# Patient Record
Sex: Male | Born: 1990
Health system: Southern US, Community
[De-identification: ages and names within clinical notes are randomized; demographics above are authoritative.]

## PROBLEM LIST (undated history)

## (undated) DIAGNOSIS — M5416 Radiculopathy, lumbar region: Secondary | ICD-10-CM

## (undated) HISTORY — PX: NO PAST SURGERIES: SHX2092

## (undated) HISTORY — DX: Radiculopathy, lumbar region: M54.16

---

## 2013-06-19 ENCOUNTER — Emergency Department: Payer: Self-pay | Admitting: Emergency Medicine

## 2013-06-19 LAB — URINALYSIS, COMPLETE
Bacteria: NONE SEEN
Blood: NEGATIVE
Glucose,UR: NEGATIVE mg/dL (ref 0–75)
Ketone: NEGATIVE
Leukocyte Esterase: NEGATIVE
Nitrite: NEGATIVE
Protein: NEGATIVE
RBC,UR: <1 /HPF (ref 0–5)
Specific Gravity: 1.027 (ref 1.003–1.030)
Squamous Epithelial: NONE SEEN
WBC UR: 1 /HPF (ref 0–5)

## 2013-06-19 LAB — CBC WITH DIFFERENTIAL/PLATELET
Basophil %: 0.6 %
Eosinophil #: 0.2 10*3/uL (ref 0.0–0.7)
Eosinophil %: 2.8 %
HGB: 16.1 g/dL (ref 13.0–18.0)
Lymphocyte #: 2 10*3/uL (ref 1.0–3.6)
MCV: 90 fL (ref 80–100)
Monocyte #: 0.9 x10 3/mm (ref 0.2–1.0)
Monocyte %: 12.1 %
Neutrophil #: 4.2 10*3/uL (ref 1.4–6.5)
Neutrophil %: 57.3 %
Platelet: 190 10*3/uL (ref 150–440)

## 2013-06-19 LAB — COMPREHENSIVE METABOLIC PANEL
Albumin: 3.8 g/dL (ref 3.4–5.0)
Alkaline Phosphatase: 88 U/L
Anion Gap: 5 — ABNORMAL LOW (ref 7–16)
EGFR (Non-African Amer.): >60
Osmolality: 278 (ref 275–301)
Sodium: 140 mmol/L (ref 136–145)
Total Protein: 7.4 g/dL (ref 6.4–8.2)

## 2013-06-19 LAB — LIPASE, BLOOD: Lipase: 151 U/L (ref 73–393)

## 2013-07-13 ENCOUNTER — Ambulatory Visit (INDEPENDENT_AMBULATORY_CARE_PROVIDER_SITE_OTHER): Payer: Managed Care, Other (non HMO) | Admitting: Family Medicine

## 2013-07-13 ENCOUNTER — Encounter: Payer: Self-pay | Admitting: Family Medicine

## 2013-07-13 VITALS — BP 100/70 | HR 64 | Ht 76.0 in | Wt 238.0 lb

## 2013-07-13 DIAGNOSIS — R1013 Epigastric pain: Secondary | ICD-10-CM

## 2013-07-13 NOTE — Progress Notes (Signed)
   Subjective:    Patient ID: Brett LevyChristoper Hegner, male    DOB: Dec 10, 1990, 23 y.o.   MRN: [id number]  HPI Approximately 3 weeks ago [facility] bloating pain and diarrhea. They apparently did an ultrasound and blood work. He was given Pepcid. One or 2 days later he was feeling better. He has had 3 episodes. The last episode occurred 4 days ago. He's had continuous pain since then. He notes that when he eats a meal, approximately 15 minutes later he will have constant pain bloating and diarrhea   Review of Systems     Objective:   Physical Exam Alert and in no distress. Cardiac exam shows regular rhythm without murmurs or gallops. Lungs clear to auscultation. Abdominal exam shows decreased bowel sounds with some slight midepigastric tenderness no rebound. Negative Murphy sign no Murphy's punch.      Assessment & Plan:  Midepigastric pain  he is to use 40 mg Prilosec to see if this will help. Also recommend he keep track of foods that make it worse specifically greasy foods. Get the records from the emergency room. Possibly order a HIDA scan.

## 2013-07-13 NOTE — Patient Instructions (Signed)
Take 2 Prilosec daily and pay attention to foods that tend to make this worse specifically greasy foods. Call me in a couple of days and let me know how you're doing

## 2013-07-15 ENCOUNTER — Telehealth: Payer: Self-pay | Admitting: *Deleted

## 2013-07-15 DIAGNOSIS — R109 Unspecified abdominal pain: Secondary | ICD-10-CM

## 2013-07-15 NOTE — Telephone Encounter (Signed)
Spoke with insurance prior approval. States they will need to have a peer to peer to approve HIDA scan. Would not speak with me, they needed a MD. Please call [phone]  Reference # [id number]

## 2013-07-16 NOTE — Telephone Encounter (Signed)
Appt sch 07-23-13 @9 :00. Prior auth. Approved [id number]  Pt aware of appt time

## 2013-07-23 ENCOUNTER — Encounter (HOSPITAL_COMMUNITY)
Admission: RE | Admit: 2013-07-23 | Discharge: 2013-07-23 | Disposition: A | Discharge status: Home / Self Care | Payer: 59 | Source: Ambulatory Visit | Attending: Family Medicine | Admitting: Family Medicine

## 2013-07-23 DIAGNOSIS — R109 Unspecified abdominal pain: Secondary | ICD-10-CM | POA: Insufficient documentation

## 2013-07-23 MED ORDER — TECHNETIUM TC 99M MEBROFENIN IV KIT
5.0000 | PACK | Freq: Once | INTRAVENOUS | Status: AC | PRN
  Administered 2013-07-23: 5 via INTRAVENOUS

## 2013-07-29 ENCOUNTER — Encounter: Payer: Self-pay | Admitting: Family Medicine

## 2013-07-29 ENCOUNTER — Ambulatory Visit (INDEPENDENT_AMBULATORY_CARE_PROVIDER_SITE_OTHER): Payer: Managed Care, Other (non HMO) | Admitting: Family Medicine

## 2013-07-29 VITALS — BP 116/68 | HR 72 | Wt 230.0 lb

## 2013-07-29 DIAGNOSIS — R109 Unspecified abdominal pain: Secondary | ICD-10-CM

## 2013-07-29 NOTE — Patient Instructions (Signed)
Take the Prilosec as needed for your pain and  pay attention to what particular foods might bring it on and if you can identify any food, go ahead and stop it.

## 2013-07-29 NOTE — Progress Notes (Signed)
   Subjective:    Patient ID: Brett Graves, male    DOB: June 10, 1991, 23 y.o.   MRN: [id number]  HPI He is here for a recheck. He now states that he is having pain every 45 days. He does seem to be related to eating steaks and salads. He does state that the prosthetic does help get rid of the pain much more quickly.  Review of Systems     Objective:   Physical Exam Alert and in no distress. Abdominal exam shows normal bowel sounds without masses or tenderness.       Assessment & Plan:  Abdominal pain  encouraged him to use Prilosec as needed. Also discussed keeping track of any particular foods that might make this worse and stop it if he finds them.

## 2013-08-06 ENCOUNTER — Encounter: Payer: Self-pay | Admitting: Family Medicine

## 2016-02-04 ENCOUNTER — Emergency Department (HOSPITAL_COMMUNITY)
Admission: EM | Admit: 2016-02-04 | Discharge: 2016-02-04 | Disposition: A | Discharge status: Home / Self Care | Payer: Self-pay | Attending: Emergency Medicine | Admitting: Emergency Medicine

## 2016-02-04 ENCOUNTER — Encounter (HOSPITAL_COMMUNITY): Payer: Self-pay | Admitting: Emergency Medicine

## 2016-02-04 DIAGNOSIS — M79604 Pain in right leg: Secondary | ICD-10-CM | POA: Insufficient documentation

## 2016-02-04 DIAGNOSIS — Z79899 Other long term (current) drug therapy: Secondary | ICD-10-CM | POA: Insufficient documentation

## 2016-02-04 MED ORDER — DICLOFENAC SODIUM 75 MG PO TBEC: 75.0000 mg | DELAYED_RELEASE_TABLET | Freq: Two times a day (BID) | ORAL | 0 refills | Status: DC

## 2016-02-04 NOTE — Discharge Instructions (Signed)
You are scheduled to have an ultrasound of your right leg tomorrow 02/05/16 here at [facility].  Your appt time is 8:30am.  Be here 15 minutes early to register.  Call the orthopedic doctor listed to arrange a follow-up appt.

## 2016-02-04 NOTE — ED Triage Notes (Signed)
Pt states his right leg went through a "dock plate" and was scratched up.  This has healed and pt states bruising has developed over the past few days on ankle with a large "knot" on lateral side of shin.

## 2016-02-04 NOTE — ED Provider Notes (Signed)
[facility]-EMERGENCY DEPT Provider Note   CSN: [id number] Arrival date & time: 02/04/16  1704  First Provider Contact:   First MD Initiated Contact with Patient 02/04/16 1807     By signing my name below, I, Soijett Blue, attest that this documentation has been prepared under the direction and in the presence of Pauline Aus, PA-C Electronically Signed: Soijett Blue, ED Scribe. 02/04/16. 6:24 PM.   History   Chief Complaint Chief Complaint  Patient presents with  . Leg Pain    HPI Brett Graves is a 25 y.o. male who presents to the Emergency Department complaining of right leg pain onset 2 weeks ago. Pt notes that his right leg slipped and went through a makeshift dock plate that wasn't properly secured to the trailer. Pt states that his right leg struck the trailer and the makeshift dock plate during the incident. Pt reports that there scratches initially to the area, and he noticed bruising to the area today. Pt states that since the incident he has been able to ambulate but not without pain to the area. Pt is having associated symptoms of bruising to right ankle and right lower leg, "knot" to lateral side of shin, numbness to right lateral lower leg, and gait problem due to pain. He notes that he has not tried any medications for the relief of his symptoms. He denies calf tenderness, right knee pain, right ankle pain, and any other symptoms.   The history is provided by the patient. No language interpreter was used.  Leg Pain   This is a new problem. The problem occurs rarely. The problem has been gradually worsening. The pain is present in the right lower leg. The pain is mild. Associated symptoms include numbness (right lateral lower leg). The symptoms are aggravated by activity and contact. He has tried cold for the symptoms. The treatment provided no relief.    History reviewed. No pertinent past medical history.  There are no active problems to display for this  patient.   History reviewed. No pertinent surgical history.     Home Medications    Prior to Admission medications   Medication Sig Start Date End Date Taking? Authorizing Provider  omeprazole (PRILOSEC OTC) 20 MG tablet Take 20 mg by mouth daily.    Historical Provider, MD    Family History History reviewed. No pertinent family history.  Social History Social History  Substance Use Topics  . Smoking status: Never Smoker  . Smokeless tobacco: Never Used  . Alcohol use No     Allergies   Review of patient's allergies indicates no known allergies.   Review of Systems Review of Systems  Musculoskeletal: Positive for gait problem (due to pain) and myalgias (right lower leg). Negative for arthralgias.  Skin: Positive for color change (bruising to right lower leg). Negative for rash and wound.  Neurological: Positive for numbness (right lateral lower leg).     Physical Exam Updated Vital Signs BP 141/72 (BP Location: Left Arm)   Pulse 67   Temp 97.7 F (36.5 C) (Oral)   Resp 16   Ht  (1.93 m)   Wt 250 lb (113.4 kg)   SpO2 100%   BMI 30.43 kg/m   Physical Exam  Constitutional: He is oriented to person, place, and time. He appears well-developed and well-nourished. No distress.  HENT:  Head: Normocephalic and atraumatic.  Eyes: EOM are normal.  Neck: Neck supple.  Cardiovascular: Normal rate.   Pulmonary/Chest: Effort normal. No respiratory distress.  Abdominal: He exhibits no distension.  Musculoskeletal: Normal range of motion.       Right ankle: Achilles tendon normal. Achilles tendon exhibits normal Thompson's test results.       Right lower leg: He exhibits deformity.   5 cm palpable mass to the lateral lower leg. No erythema. Old appearing bruises to the posterior and lateral lower leg. Compartments soft. Negative Thompson test.   Neurological: He is alert and oriented to person, place, and time.  Skin: Skin is warm and dry.  Psychiatric: He has a  normal mood and affect. His behavior is normal.  Nursing note and vitals reviewed.    ED Treatments / Results  DIAGNOSTIC STUDIES: Oxygen Saturation is 100% on RA, nl by my interpretation.    COORDINATION OF CARE: 6:16 PM Discussed treatment plan with pt at bedside which includes consult with attending and follow up for US venous DVT study tomorrow and pt agreed to plan.   Labs (all labs ordered are listed, but only abnormal results are displayed) Labs Reviewed - No data to display  EKG  EKG Interpretation None       Radiology No results found.  Procedures Procedures (including critical care time)  Medications Ordered in ED Medications - No data to display   Initial Impression / Assessment and Plan / ED Course  I have reviewed the triage vital signs and the nursing notes.  Pertinent labs & imaging results that were available during my care of the patient were reviewed by me and considered in my medical decision making (see chart for details).  Clinical Course    Injury to RLE.  NV intact.  No open wounds.  Possible muscle injury vs hematoma.  Pt scheduled to return here for venous US of the extremity.  Orthopedic referral given.  Final Clinical Impressions(s) / ED Diagnoses   Final diagnoses:  Right leg pain    New Prescriptions New Prescriptions   No medications on file   I personally performed the services described in this documentation, which was scribed in my presence. The recorded information has been reviewed and is accurate.     Pauline Aus, PA-C 02/22/16 1305    Raeford Razor, MD 02/23/16 [phone]

## 2016-02-04 NOTE — ED Triage Notes (Signed)
Pt states his calf has had increasing numbness since injury.

## 2016-02-05 ENCOUNTER — Ambulatory Visit (HOSPITAL_COMMUNITY)
Admission: RE | Admit: 2016-02-05 | Discharge: 2016-02-05 | Disposition: A | Discharge status: Home / Self Care | Payer: Self-pay | Source: Ambulatory Visit | Attending: Emergency Medicine | Admitting: Emergency Medicine

## 2016-02-05 DIAGNOSIS — M7989 Other specified soft tissue disorders: Secondary | ICD-10-CM | POA: Insufficient documentation

## 2016-02-05 DIAGNOSIS — M79604 Pain in right leg: Secondary | ICD-10-CM | POA: Insufficient documentation

## 2016-02-05 NOTE — ED Provider Notes (Signed)
Pt returns today for venous US of right lower leg, negative for DVT but with moderate sized suspected hematoma lateral to right shin.  Results discussed with patient.  Advised heat tx,, elevation, f/u with Dr. Romeo Apple whom he was referred to last night if sx persist or do not improve with conservative tx.  Advised anti inflammatories which were prescribed ytd.    Burgess Amor, PA-C 02/05/16 1719    Vanetta Mulders, MD 02/06/16 [phone]

## 2017-12-08 DIAGNOSIS — M9903 Segmental and somatic dysfunction of lumbar region: Secondary | ICD-10-CM | POA: Diagnosis not present

## 2017-12-08 DIAGNOSIS — M5442 Lumbago with sciatica, left side: Secondary | ICD-10-CM | POA: Diagnosis not present

## 2017-12-08 DIAGNOSIS — M9902 Segmental and somatic dysfunction of thoracic region: Secondary | ICD-10-CM | POA: Diagnosis not present

## 2017-12-11 DIAGNOSIS — M9902 Segmental and somatic dysfunction of thoracic region: Secondary | ICD-10-CM | POA: Diagnosis not present

## 2017-12-11 DIAGNOSIS — M9903 Segmental and somatic dysfunction of lumbar region: Secondary | ICD-10-CM | POA: Diagnosis not present

## 2017-12-11 DIAGNOSIS — M5442 Lumbago with sciatica, left side: Secondary | ICD-10-CM | POA: Diagnosis not present

## 2017-12-15 DIAGNOSIS — M5442 Lumbago with sciatica, left side: Secondary | ICD-10-CM | POA: Diagnosis not present

## 2017-12-15 DIAGNOSIS — M9903 Segmental and somatic dysfunction of lumbar region: Secondary | ICD-10-CM | POA: Diagnosis not present

## 2017-12-15 DIAGNOSIS — M9902 Segmental and somatic dysfunction of thoracic region: Secondary | ICD-10-CM | POA: Diagnosis not present

## 2017-12-17 DIAGNOSIS — M9903 Segmental and somatic dysfunction of lumbar region: Secondary | ICD-10-CM | POA: Diagnosis not present

## 2017-12-17 DIAGNOSIS — M5442 Lumbago with sciatica, left side: Secondary | ICD-10-CM | POA: Diagnosis not present

## 2017-12-17 DIAGNOSIS — M9902 Segmental and somatic dysfunction of thoracic region: Secondary | ICD-10-CM | POA: Diagnosis not present

## 2017-12-26 DIAGNOSIS — M5442 Lumbago with sciatica, left side: Secondary | ICD-10-CM | POA: Diagnosis not present

## 2017-12-26 DIAGNOSIS — M9903 Segmental and somatic dysfunction of lumbar region: Secondary | ICD-10-CM | POA: Diagnosis not present

## 2017-12-26 DIAGNOSIS — M9902 Segmental and somatic dysfunction of thoracic region: Secondary | ICD-10-CM | POA: Diagnosis not present

## 2018-01-05 DIAGNOSIS — M9902 Segmental and somatic dysfunction of thoracic region: Secondary | ICD-10-CM | POA: Diagnosis not present

## 2018-01-05 DIAGNOSIS — M5442 Lumbago with sciatica, left side: Secondary | ICD-10-CM | POA: Diagnosis not present

## 2018-01-05 DIAGNOSIS — M9903 Segmental and somatic dysfunction of lumbar region: Secondary | ICD-10-CM | POA: Diagnosis not present

## 2018-02-05 DIAGNOSIS — M9903 Segmental and somatic dysfunction of lumbar region: Secondary | ICD-10-CM | POA: Diagnosis not present

## 2018-02-05 DIAGNOSIS — M9902 Segmental and somatic dysfunction of thoracic region: Secondary | ICD-10-CM | POA: Diagnosis not present

## 2018-02-05 DIAGNOSIS — M5442 Lumbago with sciatica, left side: Secondary | ICD-10-CM | POA: Diagnosis not present

## 2018-03-19 DIAGNOSIS — M545 Low back pain: Secondary | ICD-10-CM | POA: Diagnosis not present

## 2018-03-27 DIAGNOSIS — M545 Low back pain: Secondary | ICD-10-CM | POA: Diagnosis not present

## 2018-03-30 DIAGNOSIS — M5106 Intervertebral disc disorders with myelopathy, lumbar region: Secondary | ICD-10-CM | POA: Diagnosis not present

## 2018-04-16 DIAGNOSIS — M5106 Intervertebral disc disorders with myelopathy, lumbar region: Secondary | ICD-10-CM | POA: Diagnosis not present

## 2018-04-16 DIAGNOSIS — M545 Low back pain: Secondary | ICD-10-CM | POA: Diagnosis not present

## 2018-05-05 ENCOUNTER — Encounter (HOSPITAL_COMMUNITY): Payer: Self-pay

## 2018-05-05 ENCOUNTER — Observation Stay (HOSPITAL_COMMUNITY)
Admission: EM | Admit: 2018-05-05 | Discharge: 2018-05-08 | Disposition: A | Discharge status: Home / Self Care | Payer: 59 | Attending: Internal Medicine | Admitting: Internal Medicine

## 2018-05-05 ENCOUNTER — Other Ambulatory Visit: Payer: Self-pay

## 2018-05-05 DIAGNOSIS — D72829 Elevated white blood cell count, unspecified: Secondary | ICD-10-CM | POA: Diagnosis not present

## 2018-05-05 DIAGNOSIS — R7989 Other specified abnormal findings of blood chemistry: Secondary | ICD-10-CM | POA: Diagnosis present

## 2018-05-05 DIAGNOSIS — M5416 Radiculopathy, lumbar region: Secondary | ICD-10-CM | POA: Diagnosis not present

## 2018-05-05 DIAGNOSIS — E871 Hypo-osmolality and hyponatremia: Secondary | ICD-10-CM | POA: Insufficient documentation

## 2018-05-05 DIAGNOSIS — M5117 Intervertebral disc disorders with radiculopathy, lumbosacral region: Secondary | ICD-10-CM | POA: Diagnosis not present

## 2018-05-05 DIAGNOSIS — M545 Low back pain: Secondary | ICD-10-CM | POA: Diagnosis not present

## 2018-05-05 DIAGNOSIS — R000 Tachycardia, unspecified: Secondary | ICD-10-CM | POA: Diagnosis not present

## 2018-05-05 DIAGNOSIS — Z79899 Other long term (current) drug therapy: Secondary | ICD-10-CM | POA: Diagnosis not present

## 2018-05-05 DIAGNOSIS — R945 Abnormal results of liver function studies: Secondary | ICD-10-CM

## 2018-05-05 DIAGNOSIS — M5116 Intervertebral disc disorders with radiculopathy, lumbar region: Secondary | ICD-10-CM | POA: Diagnosis present

## 2018-05-05 DIAGNOSIS — G8929 Other chronic pain: Secondary | ICD-10-CM | POA: Insufficient documentation

## 2018-05-05 DIAGNOSIS — M48061 Spinal stenosis, lumbar region without neurogenic claudication: Secondary | ICD-10-CM | POA: Insufficient documentation

## 2018-05-05 DIAGNOSIS — R52 Pain, unspecified: Secondary | ICD-10-CM

## 2018-05-05 DIAGNOSIS — Z791 Long term (current) use of non-steroidal anti-inflammatories (NSAID): Secondary | ICD-10-CM | POA: Insufficient documentation

## 2018-05-05 DIAGNOSIS — K5900 Constipation, unspecified: Secondary | ICD-10-CM | POA: Insufficient documentation

## 2018-05-05 DIAGNOSIS — M5442 Lumbago with sciatica, left side: Secondary | ICD-10-CM | POA: Diagnosis not present

## 2018-05-05 MED ORDER — OXYCODONE-ACETAMINOPHEN 5-325 MG PO TABS
2.0000 | ORAL_TABLET | ORAL | Status: AC | PRN
  Administered 2018-05-05 – 2018-05-07 (×2): 2 via ORAL
  Filled 2018-05-05 (×2): qty 2

## 2018-05-05 NOTE — ED Triage Notes (Signed)
Pt complains of left lower back pain radiating down on the left leg, has hx of degenerative disc disease. Has an appointment to see a neurologist Monday. Having difficulty ambulating

## 2018-05-06 ENCOUNTER — Emergency Department (HOSPITAL_COMMUNITY): Payer: 59

## 2018-05-06 ENCOUNTER — Observation Stay (HOSPITAL_COMMUNITY): Payer: 59

## 2018-05-06 DIAGNOSIS — M5117 Intervertebral disc disorders with radiculopathy, lumbosacral region: Secondary | ICD-10-CM

## 2018-05-06 DIAGNOSIS — M5116 Intervertebral disc disorders with radiculopathy, lumbar region: Secondary | ICD-10-CM | POA: Diagnosis not present

## 2018-05-06 DIAGNOSIS — Z8269 Family history of other diseases of the musculoskeletal system and connective tissue: Secondary | ICD-10-CM

## 2018-05-06 DIAGNOSIS — G8929 Other chronic pain: Secondary | ICD-10-CM

## 2018-05-06 DIAGNOSIS — D72829 Elevated white blood cell count, unspecified: Secondary | ICD-10-CM

## 2018-05-06 DIAGNOSIS — E871 Hypo-osmolality and hyponatremia: Secondary | ICD-10-CM

## 2018-05-06 DIAGNOSIS — R740 Nonspecific elevation of levels of transaminase and lactic acid dehydrogenase [LDH]: Secondary | ICD-10-CM | POA: Diagnosis not present

## 2018-05-06 DIAGNOSIS — M545 Low back pain: Secondary | ICD-10-CM | POA: Diagnosis not present

## 2018-05-06 LAB — CBC WITH DIFFERENTIAL/PLATELET
ABS IMMATURE GRANULOCYTES: 0.07 10*3/uL (ref 0.00–0.07)
BASOS PCT: 0 %
Basophils Absolute: 0 10*3/uL (ref 0.0–0.1)
Eosinophils Absolute: 0 10*3/uL (ref 0.0–0.5)
Eosinophils Relative: 0 %
HEMATOCRIT: 48.2 % (ref 39.0–52.0)
Hemoglobin: 15.9 g/dL (ref 13.0–17.0)
IMMATURE GRANULOCYTES: 1 %
LYMPHS ABS: 1.1 10*3/uL (ref 0.7–4.0)
Lymphocytes Relative: 8 %
MCH: 29.9 pg (ref 26.0–34.0)
MCHC: 33 g/dL (ref 30.0–36.0)
MCV: 90.8 fL (ref 80.0–100.0)
MONO ABS: 0.1 10*3/uL (ref 0.1–1.0)
MONOS PCT: 1 %
NEUTROS ABS: 11.3 10*3/uL — AB (ref 1.7–7.7)
NEUTROS PCT: 90 %
Platelets: 254 10*3/uL (ref 150–400)
RBC: 5.31 MIL/uL (ref 4.22–5.81)
RDW: 12.2 % (ref 11.5–15.5)
WBC: 12.6 10*3/uL — ABNORMAL HIGH (ref 4.0–10.5)
nRBC: 0 % (ref 0.0–0.2)

## 2018-05-06 LAB — BASIC METABOLIC PANEL
ANION GAP: 6 (ref 5–15)
BUN: 16 mg/dL (ref 6–20)
CHLORIDE: 99 mmol/L (ref 98–111)
CO2: 23 mmol/L (ref 22–32)
Calcium: 8.5 mg/dL — ABNORMAL LOW (ref 8.9–10.3)
Creatinine, Ser: 0.86 mg/dL (ref 0.61–1.24)
GFR calc Af Amer: >60 mL/min (ref >60)
GFR calc non Af Amer: >60 mL/min (ref >60)
GLUCOSE: 130 mg/dL — AB (ref 70–99)
POTASSIUM: 3.5 mmol/L (ref 3.5–5.1)
Sodium: 128 mmol/L — ABNORMAL LOW (ref 135–145)

## 2018-05-06 LAB — HEPATIC FUNCTION PANEL
ALT: 63 U/L — ABNORMAL HIGH (ref 0–44)
AST: 34 U/L (ref 15–41)
Albumin: 3.9 g/dL (ref 3.5–5.0)
Alkaline Phosphatase: 69 U/L (ref 38–126)
BILIRUBIN INDIRECT: 0.6 mg/dL (ref 0.3–0.9)
BILIRUBIN TOTAL: 0.8 mg/dL (ref 0.3–1.2)
Bilirubin, Direct: 0.2 mg/dL (ref 0.0–0.2)
Total Protein: 7.5 g/dL (ref 6.5–8.1)

## 2018-05-06 LAB — NA AND K (SODIUM & POTASSIUM), RAND UR
POTASSIUM UR: 101 mmol/L
Sodium, Ur: 68 mmol/L

## 2018-05-06 LAB — TSH: TSH: 0.662 u[IU]/mL (ref 0.350–4.500)

## 2018-05-06 LAB — HEMOGLOBIN A1C
HEMOGLOBIN A1C: 5.1 % (ref 4.8–5.6)
MEAN PLASMA GLUCOSE: 99.67 mg/dL

## 2018-05-06 LAB — OSMOLALITY: Osmolality: 298 mOsm/kg — ABNORMAL HIGH (ref 275–295)

## 2018-05-06 LAB — OSMOLALITY, URINE: OSMOLALITY UR: 891 mosm/kg (ref 300–900)

## 2018-05-06 MED ORDER — HYDROMORPHONE HCL 1 MG/ML IJ SOLN
1.0000 mg | Freq: Once | INTRAMUSCULAR | Status: AC
  Administered 2018-05-06: 1 mg via INTRAVENOUS
  Filled 2018-05-06: qty 1

## 2018-05-06 MED ORDER — SODIUM CHLORIDE 0.9 % IV SOLN
INTRAVENOUS | Status: DC
  Administered 2018-05-06 (×2): via INTRAVENOUS

## 2018-05-06 MED ORDER — KETOROLAC TROMETHAMINE 30 MG/ML IJ SOLN
30.0000 mg | Freq: Four times a day (QID) | INTRAMUSCULAR | Status: DC
  Administered 2018-05-06 – 2018-05-08 (×8): 30 mg via INTRAVENOUS
  Filled 2018-05-06 (×8): qty 1

## 2018-05-06 MED ORDER — ENOXAPARIN SODIUM 60 MG/0.6ML ~~LOC~~ SOLN
50.0000 mg | SUBCUTANEOUS | Status: DC
  Administered 2018-05-06: 50 mg via SUBCUTANEOUS
  Filled 2018-05-06: qty 0.5

## 2018-05-06 MED ORDER — HYDROMORPHONE HCL 1 MG/ML IJ SOLN
0.5000 mg | Freq: Once | INTRAMUSCULAR | Status: AC
  Administered 2018-05-06: 0.5 mg via INTRAVENOUS
  Filled 2018-05-06: qty 1

## 2018-05-06 MED ORDER — SODIUM CHLORIDE 0.9 % IV BOLUS
1000.0000 mL | Freq: Once | INTRAVENOUS | Status: AC
  Administered 2018-05-06: 1000 mL via INTRAVENOUS

## 2018-05-06 MED ORDER — KETOROLAC TROMETHAMINE 30 MG/ML IJ SOLN: 30.0000 mg | Freq: Four times a day (QID) | INTRAMUSCULAR | Status: DC | PRN

## 2018-05-06 MED ORDER — POLYETHYLENE GLYCOL 3350 17 G PO PACK
17.0000 g | PACK | Freq: Every day | ORAL | Status: DC
  Administered 2018-05-06 – 2018-05-08 (×3): 17 g via ORAL
  Filled 2018-05-06 (×3): qty 1

## 2018-05-06 MED ORDER — LIDOCAINE 5 % EX PTCH
1.0000 | MEDICATED_PATCH | CUTANEOUS | Status: DC
  Administered 2018-05-06 – 2018-05-08 (×3): 1 via TRANSDERMAL
  Filled 2018-05-06 (×3): qty 1

## 2018-05-06 MED ORDER — METHYLPREDNISOLONE SODIUM SUCC 125 MG IJ SOLR
125.0000 mg | Freq: Once | INTRAMUSCULAR | Status: AC
  Administered 2018-05-06: 125 mg via INTRAVENOUS
  Filled 2018-05-06: qty 2

## 2018-05-06 MED ORDER — DIAZEPAM 5 MG/ML IJ SOLN
5.0000 mg | Freq: Once | INTRAMUSCULAR | Status: AC
  Administered 2018-05-06: 5 mg via INTRAVENOUS
  Filled 2018-05-06: qty 2

## 2018-05-06 MED ORDER — CYCLOBENZAPRINE HCL 10 MG PO TABS
10.0000 mg | ORAL_TABLET | Freq: Three times a day (TID) | ORAL | Status: DC
  Administered 2018-05-06 – 2018-05-08 (×7): 10 mg via ORAL
  Filled 2018-05-06 (×7): qty 1

## 2018-05-06 MED ORDER — ENOXAPARIN SODIUM 60 MG/0.6ML ~~LOC~~ SOLN
60.0000 mg | SUBCUTANEOUS | Status: DC
  Administered 2018-05-07: 60 mg via SUBCUTANEOUS
  Filled 2018-05-06: qty 0.6

## 2018-05-06 MED ORDER — HYDROMORPHONE HCL 1 MG/ML IJ SOLN
0.5000 mg | INTRAMUSCULAR | Status: DC | PRN
  Administered 2018-05-06: 0.5 mg via INTRAVENOUS
  Filled 2018-05-06: qty 1

## 2018-05-06 MED ORDER — KETOROLAC TROMETHAMINE 30 MG/ML IJ SOLN
30.0000 mg | Freq: Once | INTRAMUSCULAR | Status: AC
  Administered 2018-05-06: 30 mg via INTRAVENOUS
  Filled 2018-05-06: qty 1

## 2018-05-06 NOTE — Progress Notes (Signed)
Received patient from ED, AOx4, VSS with eelvated BP at 141/80, PR 100, RR16, O2Sat at 99% on RA and pain at 3/10, oriented to room, bed controls and call light, gae ice water to drink and now resting on bed comfortably watching TV.  Will monitor.

## 2018-05-06 NOTE — ED Provider Notes (Signed)
[facility] EMERGENCY DEPARTMENT Provider Note   CSN: [id number] Arrival date & time: 05/05/18  1827     History   Chief Complaint Chief Complaint  Patient presents with  . Back Pain    HPI Brett Graves is a 27 y.o. male.  Patient with history of low back pain, DDD, recent injection by orthopedics for pain management, presents with severe exacerbation of his pain x 3 days. He is unable to bear weight on the leg due to extreme pain. He reports he has been bedridden since it started, having to crawl to the bathroom secondary to pain. No numbness or weakness of the leg. No bladder/bowel incontinence or retention. No fever, abdominal pain, groin pain. The symptoms start in the left low back and travel down the left leg anteriorly and then posteriorly to mid-calf. No known direct injury. He states his last MRI was in September of this year, and that he has an appointment with Dr. Franky Macho (neurosurgery) this week.   The history is provided by the patient. No language interpreter was used.    Past Medical History:  Diagnosis Date  . Degenerative disc disease, lumbar     There are no active problems to display for this patient.   History reviewed. No pertinent surgical history.      Home Medications    Prior to Admission medications   Medication Sig Start Date End Date Taking? Authorizing Provider  diclofenac (VOLTAREN) 75 MG EC tablet Take 1 tablet (75 mg total) by mouth 2 (two) times daily. Take with food 02/04/16   Triplett, Tammy, PA-C  omeprazole (PRILOSEC OTC) 20 MG tablet Take 20 mg by mouth daily.    [provider]    Family History History reviewed. No pertinent family history.  Social History Social History   Tobacco Use  . Smoking status: Never Smoker  . Smokeless tobacco: Never Used  Substance Use Topics  . Alcohol use: No  . Drug use: No     Allergies   Patient has no known allergies.   Review of Systems Review  of Systems  Constitutional: Positive for activity change. Negative for chills and fever.  Gastrointestinal: Negative.  Negative for abdominal pain.  Genitourinary: Negative for enuresis.  Musculoskeletal: Positive for back pain.  Skin: Negative.  Negative for color change.  Neurological: Negative.  Negative for weakness and numbness.     Physical Exam Updated Vital Signs BP 115/76 (BP Location: Left Arm)   Pulse 90   Temp 98.2 F (36.8 C) (Oral)   Resp 14   Ht 6\' 4"  (1.93 m)   Wt 117.9 kg   SpO2 98%   BMI 31.65 kg/m   Physical Exam  Constitutional: He is oriented to person, place, and time. He appears well-developed and well-nourished.  HENT:  Head: Normocephalic.  Neck: Normal range of motion. Neck supple.  Cardiovascular: Normal rate.  Pulmonary/Chest: Effort normal.  Abdominal: Soft. There is no tenderness. There is no rebound and no guarding.  Musculoskeletal: Normal range of motion.  No low back midline or paraspinal tenderness. FROM of the lower extremities without strength deficit.   Neurological: He is alert and oriented to person, place, and time. He displays normal reflexes. No sensory deficit. He exhibits normal muscle tone.  Skin: Skin is warm and dry. No rash noted.  Psychiatric: He has a normal mood and affect.     ED Treatments / Results  Labs (all labs ordered are listed, but only abnormal results are  displayed) Labs Reviewed - No data to display  EKG None  Radiology No results found. Mr Lumbar Spine Wo Contrast  Result Date: 05/06/2018 CLINICAL DATA:  Initial evaluation for acute left lower back pain extending into the left lower extremity. EXAM: MRI LUMBAR SPINE WITHOUT CONTRAST TECHNIQUE: Multiplanar, multisequence MR imaging of the lumbar spine was performed. No intravenous contrast was administered. COMPARISON:  Recent MRI from 03/27/2018. FINDINGS: Segmentation: Normal segmentation. Lowest well-formed disc labeled the L5-S1 level. Alignment:  Vertebral bodies normally aligned with preservation of the normal lumbar lordosis. No listhesis. Vertebrae: Vertebral body heights well maintained without evidence for acute or chronic fracture. Bone marrow signal intensity within normal limits. No discrete or worrisome osseous lesions. No abnormal marrow edema. Conus medullaris and cauda equina: Conus extends to the T12-L1 level. Conus and cauda equina appear normal. Paraspinal and other soft tissues: Paraspinous soft tissues within normal limits. Visualized visceral structures unremarkable. Disc levels: L1-2:  Unremarkable. L2-3:  Unremarkable. L3-4: Mild diffuse disc bulge with disc desiccation and intervertebral disc space narrowing. Associated central annular fissure. Mild bilateral facet hypertrophy. Borderline mild spinal stenosis with left lateral recess narrowing, stable. Foramina remain patent. Appearance is unchanged from previous. L4-5: Diffuse disc bulge with disc desiccation and mild intervertebral disc space narrowing. Central/right subarticular disc protrusion, extending into the right lateral recess, contacting the descending right L5 nerve root (series 12, image 25). This has slightly regressed from previous. Additional extraforaminal disc protrusion with associated annular fissure (series 12, image 24). This contacts the exiting right L4 nerve root as it courses of the right neural foramen. This appears slightly increased in size. L5-S1: Broad left subarticular disc protrusion with slight caudad angulation. Protruding disc extends into the left lateral recess, closely approximating the descending left S1 nerve root. This is slightly more prominent from previous. Mild bilateral facet hypertrophy. No significant canal stenosis. Foramina remain patent bilaterally. IMPRESSION: 1. Broad left subarticular disc protrusion at L5-S1, closely approximating and potentially affecting the descending left S1 nerve root. Finding could certainly result in left  lower extremity radicular symptoms. 2. Right extraforaminal and subarticular disc protrusions at L4-5, potentially affecting either the right L4 or descending L5 nerve roots respectively. 3. Mild disc bulge at L3-4 with borderline spinal stenosis, stable. Electronically Signed   By: Rise Mu M.D.   On: 05/06/2018 06:22    Procedures Procedures (including critical care time)  Medications Ordered in ED Medications  oxyCODONE-acetaminophen (PERCOCET/ROXICET) 5-325 MG per tablet 2 tablet (2 tablets Oral Given 05/05/18 1905)  HYDROmorphone (DILAUDID) injection 1 mg (has no administration in time range)  ketorolac (TORADOL) 30 MG/ML injection 30 mg (has no administration in time range)  diazepam (VALIUM) injection 5 mg (has no administration in time range)     Initial Impression / Assessment and Plan / ED Course  I have reviewed the triage vital signs and the nursing notes.  Pertinent labs & imaging results that were available during my care of the patient were reviewed by me and considered in my medical decision making (see chart for details).     Patient with history of low back pain here with 3 days onset of severe pain causing inability to walk. No neurologic deficits on exam. He reports having an MRI in September of this year and has been referred to neurosurgery.   Will start IV and attempt to improve/constrol pain.   MR from 03/27/18 Impression: IMPRESSION: Lumbar disc herniations at L4-5, RIGHT and L5-S1, LEFT. Annular rent L3-4.  Posterior element  hypertrophy with short pedicles contribute to mild multilevel stenosis, most prominent at L4-5.  3:40 - patient has received Dilaudid x 2, IV Valium, solumedrol and toradol. He reports he continues to be unable to sit up secondary to pain. Feel given the acute worsening and the severity of the pain, repeat MRI is warranted. Discussed this with the patient who is agreeable.    7:00 - Multiple medications for pain provided.  MRI largely unchanged from previous. Attempt to ambulate the patient fails secondary to being unable to bear weight on the left LE. Will admit for pain management.   Final Clinical Impressions(s) / ED Diagnoses   Final diagnoses:  None   1. Low back pain 2. Lumbar radiculopathy  ED Discharge Orders    None       Elpidio Anis, PA-C 05/06/18 0715    Geoffery Lyons, MD 05/06/18 [phone]

## 2018-05-06 NOTE — ED Notes (Signed)
Breakfast tray at bedside 

## 2018-05-06 NOTE — ED Notes (Signed)
Pt. Return from MRI via stretcher. 

## 2018-05-06 NOTE — Progress Notes (Signed)
Patient ID: Brett Graves, male   DOB: February 27, 1991, 27 y.o.   MRN: [id number] I have reviewed the Mri's for [name]. He does not have any significant pathology that I would attribute to the amount of pain which is described. I will see him tomorrow in consultation.

## 2018-05-06 NOTE — H&P (Addendum)
Date: 05/06/2018               Patient Name:  Brett Graves MRN: [id number]  DOB: 1991-01-31 Age / Sex: 27 y.o., male   PCP: Patient, No Pcp Per         Medical Service: Internal Medicine Teaching Service         Attending Physician: Dr. Geoffery Lyons, MD    First Contact: Dr. Maryla Morrow Pager: [phone]  Second Contact: Dr. Caron Presume Pager: [phone]       After Hours (After 5p/  First Contact Pager: [phone]  weekends / holidays): Second Contact Pager: [phone]   Chief Complaint: Worsening of lower back pain radiating to left leg for the past 4 days.  History of Present Illness: Brett Graves is a 27 y.o. gentleman with no significant past medical history except lower back pain due to DDD  for 1 year, presented to ED with worsening of his back pain to the point that he cannot stand on his legs for the past 4 days.  According to patient his pain started to get worse around May 2019 when he started seeing a chiropractor with no relief till September 2019, earlier September he saw Dr. Delbert Harness and received a steroid injection which resulted in a temporary relief of his pain lasting for few days only. Because of his worsening pain he was referred to see a neurosurgeon Dr. Franky Macho, he is having upcoming appointment this Monday on 05/11/18. For the past 3 to 4 days his pain got acutely worse to the point that he cannot bear weight on his legs especially left.  He is continuously having lower back pain radiating to the left leg all the way to the ankle.  He denies any tingling or numbness.  He denies any focal weakness.  He denies any urinary or fecal incontinence. He was using ibuprofen with not much relief, also tried his girlfriend's Flexeril which does not seem helping either.  Pain increased with sitting up and standing, relieved with lying still. He denies pain anywhere else, no blurry vision, no change in his appetite or weight.  Denies any shortness of breath.  He has an  history of occasionally getting constipated, last bowel movement was 4 days ago. He denies any urinary symptoms.  ED course.  Patient was found to be in pain and unable to bear weight.  He was neurologically intact, got 1 dose of Toradol, 3 doses of Dilaudid, 1 Valium and 1 Solu-Medrol with no relief.  Asked for admission for pain control.  Meds:  Current Meds  Medication Sig  . ibuprofen (ADVIL,MOTRIN) 200 MG tablet Take 600 mg by mouth every 6 (six) hours as needed (for back pain).  Marland Kitchen omeprazole (PRILOSEC OTC) 20 MG tablet Take 20 mg by mouth daily as needed (for indigestion or reflux).    Allergies: Allergies as of 05/05/2018  . (No Known Allergies)   Past Medical History:  Diagnosis Date  . Degenerative disc disease, lumbar     Family History: Multiple family members with degenerative disc disease and lower back problems.  Father with heart problem in 8s.  Social History: Work as a Games developer, lives with his girlfriend, denies any smoking or illicit drug use.  Uses alcohol occasionally.  Review of Systems: A complete ROS was negative except as per HPI.   Physical Exam: Blood pressure 121/81, pulse 75, temperature 98.2 F (36.8 C), temperature source Oral, resp. rate 16, height 6\' 4"  (1.93 m), weight  117.9 kg, SpO2 99 %. Vitals:   05/06/18 1004 05/06/18 1030 05/06/18 1115 05/06/18 1230  BP: 117/90 138/87 132/81 133/76  Pulse: 87 95 100 (!) 103  Resp:      Temp:      TempSrc:      SpO2: 99% 98% 97% 98%  Weight:      Height:       General: Vital signs reviewed.  Patient is well-developed and well-nourished, in no acute distress and cooperative with exam.  Head: Normocephalic and atraumatic. Eyes: EOMI, conjunctivae normal, no scleral icterus.  Neck: Supple, trachea midline, normal ROM, no JVD, masses, thyromegaly, or carotid bruit present.  Cardiovascular: RRR, S1 normal, S2 normal, no murmurs, gallops, or rubs. Pulmonary/Chest: Clear to auscultation bilaterally,  no wheezes, rales, or rhonchi. Abdominal: Soft, non-tender, non-distended, BS +, no masses, organomegaly, or guarding present.  Musculoskeletal: No joint deformities, erythema, or stiffness, ROM full and nontender.  Positive straight leg with left leg raise. Extremities: No lower extremity edema bilaterally,  pulses symmetric and intact bilaterally. No cyanosis or clubbing. Neurological: A&O x3, Strength is normal and symmetric bilaterally, cranial nerve II-XII are grossly intact, no focal motor deficit, sensory intact to light touch bilaterally.  Skin: Warm, dry and intact. No rashes or erythema. Psychiatric: Normal mood and affect. speech and behavior is normal. Cognition and memory are normal.  MRI lumbar spine. FINDINGS: Segmentation: Normal segmentation. Lowest well-formed disc labeled the L5-S1 level.  Alignment: Vertebral bodies normally aligned with preservation of the normal lumbar lordosis. No listhesis.  Vertebrae: Vertebral body heights well maintained without evidence for acute or chronic fracture. Bone marrow signal intensity within normal limits. No discrete or worrisome osseous lesions. No abnormal marrow edema.  Conus medullaris and cauda equina: Conus extends to the T12-L1 level. Conus and cauda equina appear normal.  Paraspinal and other soft tissues: Paraspinous soft tissues within normal limits. Visualized visceral structures unremarkable.  Disc levels:  L1-2:  Unremarkable.  L2-3:  Unremarkable.  L3-4: Mild diffuse disc bulge with disc desiccation and intervertebral disc space narrowing. Associated central annular fissure. Mild bilateral facet hypertrophy. Borderline mild spinal stenosis with left lateral recess narrowing, stable. Foramina remain patent. Appearance is unchanged from previous.  L4-5: Diffuse disc bulge with disc desiccation and mild intervertebral disc space narrowing. Central/right subarticular disc protrusion, extending into  the right lateral recess, contacting the descending right L5 nerve root (series 12, image 25). This has slightly regressed from previous. Additional extraforaminal disc protrusion with associated annular fissure (series 12, image 24). This contacts the exiting right L4 nerve root as it courses of the right neural foramen. This appears slightly increased in size.  L5-S1: Broad left subarticular disc protrusion with slight caudad angulation. Protruding disc extends into the left lateral recess, closely approximating the descending left S1 nerve root. This is slightly more prominent from previous. Mild bilateral facet hypertrophy. No significant canal stenosis. Foramina remain patent bilaterally.  IMPRESSION: 1. Broad left subarticular disc protrusion at L5-S1, closely approximating and potentially affecting the descending left S1 nerve root. Finding could certainly result in left lower extremity radicular symptoms. 2. Right extraforaminal and subarticular disc protrusions at L4-5, potentially affecting either the right L4 or descending L5 nerve roots respectively. 3. Mild disc bulge at L3-4 with borderline spinal stenosis, stable.  Assessment & Plan by Problem:  Timmie Dugue is a 27 y.o. gentleman with no significant past medical history except lower back pain due to DDD  for 1 year, presented to ED with worsening  of his back pain to the point that he cannot stand on his legs for the past 4 days.  Lumbar radiculopathy.  Patient has worsening of his lumbar radiculopathy due to degenerative disc disease.  MRI done today was without any acute change, having multiple disc protrusions with a close proximity to left S1 and right L4 and 5. Call Dr. Jackelyn Knife of his and talk with the new patient coordinator to see if they would like to see him as an inpatient or he should follow-up according to his scheduled outpatient appointment on Monday. - x-rays of SI joint -Toradol every 6  hourly. -Flexeril 10 mg 3 times a day. -Lidocaine patch. -PT/OT evaluation.  Hyponatremia.  Patient was also found to have mild hyponatremia with sodium of 128.  Glucose was 130, apparently patient did not eat anything overnight before the labs.  He did receive 1 L of fluid in the ED. -We will check serum and urine osmolality. -Urinary sodium. -A1c -TSH  Mild leukocytosis.  Most likely due to Solu-Medrol as the labs were drawn few of hours after he got his Solu-Medrol 125 mg.  No other sign of infection.  CODE STATUS.  Full DVT prophylaxis.  Lovenox Diet.  Regular.  Dispo: Admit patient to Observation with expected length of stay less than 2 midnights.  SignedArnetha Courser, MD 05/06/2018, 7:38 AM  Pager: [phone]

## 2018-05-07 ENCOUNTER — Other Ambulatory Visit: Payer: Self-pay

## 2018-05-07 ENCOUNTER — Encounter (HOSPITAL_COMMUNITY): Payer: Self-pay | Admitting: General Practice

## 2018-05-07 DIAGNOSIS — R740 Nonspecific elevation of levels of transaminase and lactic acid dehydrogenase [LDH]: Secondary | ICD-10-CM

## 2018-05-07 DIAGNOSIS — M5126 Other intervertebral disc displacement, lumbar region: Secondary | ICD-10-CM | POA: Diagnosis not present

## 2018-05-07 DIAGNOSIS — M5116 Intervertebral disc disorders with radiculopathy, lumbar region: Secondary | ICD-10-CM

## 2018-05-07 DIAGNOSIS — R1011 Right upper quadrant pain: Secondary | ICD-10-CM

## 2018-05-07 DIAGNOSIS — G8929 Other chronic pain: Secondary | ICD-10-CM | POA: Diagnosis not present

## 2018-05-07 LAB — CBC
HCT: 43.3 % (ref 39.0–52.0)
Hemoglobin: 14 g/dL (ref 13.0–17.0)
MCH: 29.2 pg (ref 26.0–34.0)
MCHC: 32.3 g/dL (ref 30.0–36.0)
MCV: 90.2 fL (ref 80.0–100.0)
PLATELETS: 282 10*3/uL (ref 150–400)
RBC: 4.8 MIL/uL (ref 4.22–5.81)
RDW: 12 % (ref 11.5–15.5)
WBC: 18.2 10*3/uL — AB (ref 4.0–10.5)
nRBC: 0 % (ref 0.0–0.2)

## 2018-05-07 LAB — BASIC METABOLIC PANEL
ANION GAP: 7 (ref 5–15)
BUN: 18 mg/dL (ref 6–20)
CO2: 23 mmol/L (ref 22–32)
Calcium: 8.8 mg/dL — ABNORMAL LOW (ref 8.9–10.3)
Chloride: 110 mmol/L (ref 98–111)
Creatinine, Ser: 0.9 mg/dL (ref 0.61–1.24)
GFR calc Af Amer: >60 mL/min (ref >60)
GLUCOSE: 141 mg/dL — AB (ref 70–99)
POTASSIUM: 3.5 mmol/L (ref 3.5–5.1)
SODIUM: 140 mmol/L (ref 135–145)

## 2018-05-07 LAB — HIV ANTIBODY (ROUTINE TESTING W REFLEX): HIV Screen 4th Generation wRfx: NONREACTIVE

## 2018-05-07 NOTE — Consult Note (Signed)
Reason for Consult:left lower extremity  Referring Physician: hoffman, e c  Brett Graves is an 27 y.o. male.  HPI: whom has had back pain since May of this year. Only since Sunday of this week has he had a severe exacerbation of pain in the left lower extremity. He has been treated by a chiropractor, and treated and evaluated at [facility]. He has received an esi approximately 3 weeks ago. The thigh and leg felt better approximately 3 days. He came to the [facility] ED on Tuesday evening and received an MRI. He has yet to be discharged, but his pain is significantly better. He has no bowel and or bladder problems. Walking will exacerbate his pain.   Past Medical History:  Diagnosis Date  . Degenerative disc disease, lumbar     Past Surgical History:  Procedure Laterality Date  . NO PAST SURGERIES      History reviewed. No pertinent family history.  Social History:  reports that he has never smoked. He has never used smokeless tobacco. He reports that he drank alcohol. He reports that he does not use drugs.  Allergies: No Known Allergies  Medications: I have reviewed the patient's current medications.  Results for orders placed or performed during the hospital encounter of 05/05/18 (from the past 48 hour(s))  CBC with Differential     Status: Abnormal   Collection Time: 05/06/18  8:28 AM  Result Value Ref Range   WBC 12.6 (H) 4.0 - 10.5 K/uL   RBC 5.31 4.22 - 5.81 MIL/uL   Hemoglobin 15.9 13.0 - 17.0 g/dL   HCT 48.2 39.0 - 52.0 %   MCV 90.8 80.0 - 100.0 fL   MCH 29.9 26.0 - 34.0 pg   MCHC 33.0 30.0 - 36.0 g/dL   RDW 12.2 11.5 - 15.5 %   Platelets 254 150 - 400 K/uL   nRBC 0.0 0.0 - 0.2 %   Neutrophils Relative % 90 %   Neutro Abs 11.3 (H) 1.7 - 7.7 K/uL   Lymphocytes Relative 8 %   Lymphs Abs 1.1 0.7 - 4.0 K/uL   Monocytes Relative 1 %   Monocytes Absolute 0.1 0.1 - 1.0 K/uL   Eosinophils Relative 0 %   Eosinophils Absolute 0.0 0.0 - 0.5 K/uL   Basophils Relative 0 %    Basophils Absolute 0.0 0.0 - 0.1 K/uL   Immature Granulocytes 1 %   Abs Immature Granulocytes 0.07 0.00 - 0.07 K/uL    Comment: Performed at [facility], [address] [place], [place] [address]  Basic metabolic panel     Status: Abnormal   Collection Time: 05/06/18  8:28 AM  Result Value Ref Range   Sodium 128 (L) 135 - 145 mmol/L   Potassium 3.5 3.5 - 5.1 mmol/L   Chloride 99 98 - 111 mmol/L   CO2 23 22 - 32 mmol/L   Glucose, Bld 130 (H) 70 - 99 mg/dL   BUN 16 6 - 20 mg/dL   Creatinine, Ser 0.86 0.61 - 1.24 mg/dL   Calcium 8.5 (L) 8.9 - 10.3 mg/dL   GFR calc non Af Amer >60 >60 mL/min   GFR calc Af Amer >60 >60 mL/min    Comment: (NOTE) The eGFR has been calculated using the CKD EPI equation. This calculation has not been validated in all clinical situations. eGFR's persistently <60 mL/min signify possible Chronic Kidney Disease.    Anion gap 6 5 - 15    Comment: Performed at [facility], [place] [address] [place], [place] [address]  HIV antibody (Routine Testing)     Status: None   Collection Time: 05/06/18  9:21 AM  Result Value Ref Range   HIV Screen 4th Generation wRfx Non Reactive Non Reactive    Comment: (NOTE) Performed At: [facility] [place], [place] [id number] Rush Farmer MD VO:[id number]   Osmolality, urine     Status: None   Collection Time: 05/06/18  1:43 PM  Result Value Ref Range   Osmolality, Ur 891 300 - 900 mOsm/kg    Comment: Performed at [facility], [place] [address] [place], [place] [address]  Na and K (sodium & potassium), rand urine     Status: None   Collection Time: 05/06/18  1:43 PM  Result Value Ref Range   Sodium, Ur 68 mmol/L   Potassium Urine 101 mmol/L    Comment: Performed at [place] [address], [place], [place] [address]  Osmolality     Status: Abnormal   Collection Time: 05/06/18  1:44 PM  Result Value Ref Range   Osmolality 298 (H) 275 - 295 mOsm/kg    Comment:  Performed at [facility], [place] [address], [place], [place] [address]  Hemoglobin A1c     Status: None   Collection Time: 05/06/18  1:44 PM  Result Value Ref Range   Hgb A1c MFr Bld 5.1 4.8 - 5.6 %    Comment: (NOTE) Pre diabetes:          5.7%-6.4% Diabetes:              >6.4% Glycemic control for   <7.0% adults with diabetes    Mean Plasma Glucose 99.67 mg/dL    Comment: Performed at [place] [address], [place], [place] [address]  Hepatic function panel     Status: Abnormal   Collection Time: 05/06/18  1:44 PM  Result Value Ref Range   Total Protein 7.5 6.5 - 8.1 g/dL   Albumin 3.9 3.5 - 5.0 g/dL   AST 34 15 - 41 U/L   ALT 63 (H) 0 - 44 U/L   Alkaline Phosphatase 69 38 - 126 U/L   Total Bilirubin 0.8 0.3 - 1.2 mg/dL   Bilirubin, Direct 0.2 0.0 - 0.2 mg/dL   Indirect Bilirubin 0.6 0.3 - 0.9 mg/dL    Comment: Performed at [place] [address] [place], [place] [address]  TSH     Status: None   Collection Time: 05/06/18  1:44 PM  Result Value Ref Range   TSH 0.662 0.350 - 4.500 uIU/mL    Comment: Performed by a 3rd Generation assay with a functional sensitivity of <=0.01 uIU/mL. Performed at [facility], [place] [address], [place], [place] [address]   Basic metabolic panel     Status: Abnormal   Collection Time: 05/07/18  2:35 AM  Result Value Ref Range   Sodium 140 135 - 145 mmol/L   Potassium 3.5 3.5 - 5.1 mmol/L   Chloride 110 98 - 111 mmol/L   CO2 23 22 - 32 mmol/L   Glucose, Bld 141 (H) 70 - 99 mg/dL   BUN 18 6 - 20 mg/dL   Creatinine, Ser 0.90 0.61 - 1.24 mg/dL   Calcium 8.8 (L) 8.9 - 10.3 mg/dL   GFR calc non Af Amer >60 >60 mL/min   GFR calc Af Amer >60 >60 mL/min  Comment: (NOTE) The eGFR has been calculated using the CKD EPI equation. This calculation has not been validated in all clinical situations. eGFR's persistently <60 mL/min signify possible Chronic Kidney Disease.    Anion gap 7 5 - 15    Comment: Performed  at [place] [address] [place] [address]  CBC     Status: Abnormal   Collection Time: 05/07/18  2:35 AM  Result Value Ref Range   WBC 18.2 (H) 4.0 - 10.5 K/uL   RBC 4.80 4.22 - 5.81 MIL/uL   Hemoglobin 14.0 13.0 - 17.0 g/dL   HCT 43.3 39.0 - 52.0 %   MCV 90.2 80.0 - 100.0 fL   MCH 29.2 26.0 - 34.0 pg   MCHC 32.3 30.0 - 36.0 g/dL   RDW 12.0 11.5 - 15.5 %   Platelets 282 150 - 400 K/uL   nRBC 0.0 0.0 - 0.2 %    Comment: Performed at [facility], [place] [address] [place], [place] [address]    Dg Si Joints  Result Date: 05/06/2018 CLINICAL DATA:  Lower back and pelvic pain since May. EXAM: BILATERAL SACROILIAC JOINTS - 3+ VIEW COMPARISON:  None. FINDINGS: The sacroiliac joint spaces are maintained and there is no evidence of arthropathy. No other bone abnormalities are seen. IMPRESSION: Negative. Electronically Signed   By: Ashley Royalty M.D.   On: 05/06/2018 14:25   Mr Lumbar Spine Wo Contrast  Result Date: 05/06/2018 CLINICAL DATA:  Initial evaluation for acute left lower back pain extending into the left lower extremity. EXAM: MRI LUMBAR SPINE WITHOUT CONTRAST TECHNIQUE: Multiplanar, multisequence MR imaging of the lumbar spine was performed. No intravenous contrast was administered. COMPARISON:  Recent MRI from 03/27/2018. FINDINGS: Segmentation: Normal segmentation. Lowest well-formed disc labeled the L5-S1 level. Alignment: Vertebral bodies normally aligned with preservation of the normal lumbar lordosis. No listhesis. Vertebrae: Vertebral body heights well maintained without evidence for acute or chronic fracture. Bone marrow signal intensity within normal limits. No discrete or worrisome osseous lesions. No abnormal marrow edema. Conus medullaris and cauda equina: Conus extends to the T12-L1 level. Conus and cauda equina appear normal. Paraspinal and other soft tissues: Paraspinous soft tissues within normal limits. Visualized visceral structures  unremarkable. Disc levels: L1-2:  Unremarkable. L2-3:  Unremarkable. L3-4: Mild diffuse disc bulge with disc desiccation and intervertebral disc space narrowing. Associated central annular fissure. Mild bilateral facet hypertrophy. Borderline mild spinal stenosis with left lateral recess narrowing, stable. Foramina remain patent. Appearance is unchanged from previous. L4-5: Diffuse disc bulge with disc desiccation and mild intervertebral disc space narrowing. Central/right subarticular disc protrusion, extending into the right lateral recess, contacting the descending right L5 nerve root (series 12, image 25). This has slightly regressed from previous. Additional extraforaminal disc protrusion with associated annular fissure (series 12, image 24). This contacts the exiting right L4 nerve root as it courses of the right neural foramen. This appears slightly increased in size. L5-S1: Broad left subarticular disc protrusion with slight caudad angulation. Protruding disc extends into the left lateral recess, closely approximating the descending left S1 nerve root. This is slightly more prominent from previous. Mild bilateral facet hypertrophy. No significant canal stenosis. Foramina remain patent bilaterally. IMPRESSION: 1. Broad left subarticular disc protrusion at L5-S1, closely approximating and potentially affecting the descending left S1 nerve root. Finding could certainly result in left lower extremity radicular symptoms. 2. Right extraforaminal and subarticular disc protrusions at L4-5, potentially affecting either the right L4 or descending  L5 nerve roots respectively. 3. Mild disc bulge at L3-4 with borderline spinal stenosis, stable. Electronically Signed   By: Jeannine Boga M.D.   On: 05/06/2018 06:22    Review of Systems  Constitutional: Negative.   HENT: Negative.   Eyes: Negative.   Respiratory: Negative.   Cardiovascular: Negative.   Gastrointestinal: Negative.   Genitourinary: Negative.    Musculoskeletal: Positive for back pain.       Left lower extremity pain  Skin: Negative.   Neurological: Negative.   Endo/Heme/Allergies: Negative.   Psychiatric/Behavioral: Negative.    Blood pressure 119/66, pulse 81, temperature 98 F (36.7 C), temperature source Oral, resp. rate 18, height 6' 4"  (1.93 m), weight 113.4 kg, SpO2 100 %. Physical Exam  Constitutional: He is oriented to person, place, and time. He appears well-developed and well-nourished. No distress.  HENT:  Head: Normocephalic and atraumatic.  Right Ear: External ear normal.  Left Ear: External ear normal.  Eyes: Pupils are equal, round, and reactive to light. Conjunctivae and EOM are normal. Right eye exhibits no discharge. Left eye exhibits no discharge.  Neck: Normal range of motion. Neck supple.  Cardiovascular: Normal rate, regular rhythm, normal heart sounds and intact distal pulses.  Respiratory: Effort normal and breath sounds normal.  GI: Soft. Bowel sounds are normal.  Musculoskeletal: Normal range of motion.  Neurological: He is alert and oriented to person, place, and time. He has normal strength and normal reflexes. He displays normal reflexes. No cranial nerve deficit. He exhibits normal muscle tone. Coordination normal. He displays no Babinski's sign on the right side. He displays no Babinski's sign on the left side.  Reflex Scores:      Tricep reflexes are 2+ on the right side and 2+ on the left side.      Bicep reflexes are 2+ on the right side and 2+ on the left side.      Brachioradialis reflexes are 2+ on the right side and 2+ on the left side.      Patellar reflexes are 2+ on the right side and 2+ on the left side.      Achilles reflexes are 2+ on the right side and 2+ on the left side. Skin: Skin is warm and dry.  Psychiatric: He has a normal mood and affect. His behavior is normal. Judgment and thought content normal.    Assessment/Plan: Would recommend that he try a second injection in  the lumbar spine. He does not have clear cut compression of the left L5 root on the MRI's. The cauda equina and conus medullaris are normal. Large right sided disc at L4/5. Do not believe he is a surgical candidate at this time. [place] for discharge at this time. He will contact my office after the injection.   Savir Blanke L 05/07/2018, 5:35 PM

## 2018-05-07 NOTE — Evaluation (Signed)
Physical Therapy Evaluation Patient Details Name: Brett Graves MRN: [id number] DOB: 16-May-1991 Today's Date: 05/07/2018   History of Present Illness  27 year old male with PMH of degenerative disc disease of his lumbar spine who presented with acute on chronic low back pain with left leg radiculopathy over the last 4 days. A repeat MRI of his lumbar spine was obtained which does note his degenerative disc disease and possibly some herniation onto the left S1 nerve root that could cause his current symptoms.  No emergent findings.  Clinical Impression  Patient presents with back/hip pain impacting mobility. States pain is worse in standing with radiating symptoms down LLE. Pain is better in supine. Education re: Psychiatrist with mobility, log roll technique, use of RW etc. Pt independent PTA and lives with g/f. Reports hx of LLE pain and has seen a chiropractor. Tolerated short distance ambulation today with use of RW but not able totolerate sitting in recliner due topain. Pain worsened with increased mobility despite being premedicated. Will follow acutely to maximize independence and mobility prior to return home. Recommend OPPT.    Follow Up Recommendations Outpatient PT;Supervision for mobility/OOB    Equipment Recommendations  Rolling walker with 5" wheels    Recommendations for Other Services       Precautions / Restrictions Precautions Precautions: Back;Other (comment) Precaution Booklet Issued: No Precaution Comments: Reviewed precautions Restrictions Weight Bearing Restrictions: No      Mobility  Bed Mobility Overal bed mobility: Needs Assistance Bed Mobility: Rolling;Sidelying to Sit;Sit to Supine Rolling: Supervision Sidelying to sit: Supervision   Sit to supine: Supervision;HOB elevated   General bed mobility comments: Extra time and effort. Supervision forsafety. Cues for log roll technique.  Transfers Overall transfer level: Needs  assistance Equipment used: Rolling walker (2 wheeled) Transfers: Sit to/from Stand Sit to Stand: Min guard         General transfer comment: to/from EOB, comfort height toilet and recliner. Leans to right side a bit to unweight LLE due to increased pain with weightbearing.   Ambulation/Gait Ambulation/Gait assistance: Min guard Gait Distance (Feet): (within room ambulation) Assistive device: Rolling walker (2 wheeled) Gait Pattern/deviations: Step-to pattern;Step-through pattern;Decreased stance time - left;Decreased step length - right Gait velocity: decreased   General Gait Details: Slow, mostly steady gait with decreased WB through LLE due to pain, Walkng on left toes initially. Increased WB through BUEs  Stairs            Wheelchair Mobility    Modified Rankin (Stroke Patients Only)       Balance Overall balance assessment: Needs assistance Sitting-balance support: Feet supported;No upper extremity supported Sitting balance-Leahy Scale: Good   Postural control: Right lateral lean Standing balance support: Bilateral upper extremity supported;During functional activity Standing balance-Leahy Scale: Fair Standing balance comment: rw for external support to off-load LLE 2/2 pain                             Pertinent Vitals/Pain Pain Assessment: Faces Faces Pain Scale: Hurts whole lot Pain Location: L hip and low back/sacral area extending distally. 8/10 at end of mobility, improved a fair amount once returned to supine position Pain Descriptors / Indicators: Tingling;Sharp;Guarding;Grimacing Pain Intervention(s): Monitored during session;Repositioned;Premedicated before session;Limited activity within patient's tolerance    Home Living Family/patient expects to be discharged to:: Private residence Living Arrangements: Spouse/significant other Available Help at Discharge: Family;Available PRN/intermittently Type of Home: House Home Access: Stairs to  enter Entrance Stairs-Rails:  Right Entrance Stairs-Number of Steps: 3 Home Layout: One level Home Equipment: None      Prior Function Level of Independence: Independent         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   Dominant Hand: Right    Extremity/Trunk Assessment   Upper Extremity Assessment Upper Extremity Assessment: Defer to OT evaluation    Lower Extremity Assessment Lower Extremity Assessment: LLE deficits/detail LLE Deficits / Details: radiating symptoms into foot from buttocks    Cervical / Trunk Assessment Cervical / Trunk Assessment: Other exceptions Cervical / Trunk Exceptions: offloads towards right  Communication   Communication: No difficulties  Cognition Arousal/Alertness: Awake/alert Behavior During Therapy: WFL for tasks assessed/performed Overall Cognitive Status: Within Functional Limits for tasks assessed                                        General Comments General comments (skin integrity, edema, etc.): unable to tolerate sitting in recliner due to increased pain    Exercises     Assessment/Plan    PT Assessment Patient needs continued PT services  PT Problem List Decreased mobility;Decreased knowledge of precautions;Pain;Decreased activity tolerance;Decreased knowledge of use of DME       PT Treatment Interventions Functional mobility training;Balance training;Patient/family education;DME instruction;Gait training;Therapeutic activities;Stair training;Therapeutic exercise;Neuromuscular re-education    PT Goals (Current goals can be found in the Care Plan section)  Acute Rehab PT Goals Patient Stated Goal: reduce pain, return to PLOF and go home PT Goal Formulation: With patient Time For Goal Achievement: 05/21/18 Potential to Achieve Goals: Good    Frequency Min 3X/week   Barriers to discharge Inaccessible home environment;Decreased caregiver support stairs to enter home    Co-evaluation  PT/OT/SLP Co-Evaluation/Treatment: Yes Reason for Co-Treatment: For patient/therapist safety;To address functional/ADL transfers PT goals addressed during session: Mobility/safety with mobility;Proper use of DME OT goals addressed during session: ADL's and self-care       AM-PAC PT "6 Clicks" Daily Activity  Outcome Measure Difficulty turning over in bed (including adjusting bedclothes, sheets and blankets)?: A Little Difficulty moving from lying on back to sitting on the side of the bed? : A Little Difficulty sitting down on and standing up from a chair with arms (e.g., wheelchair, bedside commode, etc,.)?: None Help needed moving to and from a bed to chair (including a wheelchair)?: A Little Help needed walking in hospital room?: A Little Help needed climbing 3-5 steps with a railing? : A Little 6 Click Score: 19    End of Session Equipment Utilized During Treatment: Gait belt Activity Tolerance: Patient limited by pain Patient left: in bed;with call bell/phone within reach Nurse Communication: Mobility status PT Visit Diagnosis: Difficulty in walking, not elsewhere classified (R26.2)    Time: 1610-9604 PT Time Calculation (min) (ACUTE ONLY): 26 min   Charges:   PT Evaluation $PT Eval Low Complexity: 1 Low          Mylo Red, PT, DPT Acute Rehabilitation Services Pager [phone] Office [phone]      Blake Divine A Lanier Ensign 05/07/2018, 1:06 PM

## 2018-05-07 NOTE — Evaluation (Signed)
Occupational Therapy Evaluation Patient Details Name: Brett Graves MRN: [id number] DOB: February 24, 1991 Today's Date: 05/07/2018    History of Present Illness 27 year old male with PMH of degenerative disc disease of his lumbar spine who presented with acute on chronic low back pain with left leg radiculopathy over the last 4 days. A repeat MRI of his lumbar spine was obtained which does note his degenerative disc disease and possibly some herniation onto the left S1 nerve root that could cause his current symptoms.  No emergent findings.   Clinical Impression   Pt admitted with the above diagnoses and presents with below problem list. Pt will benefit from continued acute OT to address the below listed deficits and maximize independence with basic ADLs prior to d/c home. PTA pt was independent with ADLs. Pt is currently min guard to min A with LB ADLs, min guard for bed mobility and to ambulate in the room and to/from bathroom using a rw. Of note, pt given IV pain meds at start of session. Pt unable to tolerate sitting up in a recliner at end of session (FACES 8/10 low back/sacrum radiating down LLE). Pain lessened once returned to supine position. Encouraged pt to try sitting up in recliner later today after resting from session.      Follow Up Recommendations  Supervision - Intermittent;Outpatient OT    Equipment Recommendations  3 in 1 bedside commode    Recommendations for Other Services       Precautions / Restrictions Precautions Precautions: Back;Other (comment)(for comfort) Restrictions Weight Bearing Restrictions: No      Mobility Bed Mobility Overal bed mobility: Needs Assistance Bed Mobility: Rolling;Sidelying to Sit Rolling: Supervision Sidelying to sit: Min guard       General bed mobility comments: Extra time and effort. min guard for safety  Transfers Overall transfer level: Needs assistance Equipment used: Rolling walker (2 wheeled) Transfers: Sit  to/from Stand Sit to Stand: Min guard         General transfer comment: to/from EOB, comfort height toilet and recliner. Leans to right side a bit to unweight LLE due to increased pain with weightbearing.     Balance Overall balance assessment: Needs assistance       Postural control: Right lateral lean(due to pain with WB LLE) Standing balance support: Bilateral upper extremity supported;During functional activity Standing balance-Leahy Scale: Fair Standing balance comment: rw for external support to off-load LLE 2/2 pain                           ADL either performed or assessed with clinical judgement   ADL Overall ADL's : Needs assistance/impaired Eating/Feeding: Set up;Bed level;Sitting   Grooming: Set up;Sitting;Bed level   Upper Body Bathing: Set up;Sitting   Lower Body Bathing: Minimal assistance;Min guard;Sitting/lateral leans;Sit to/from stand   Upper Body Dressing : Set up;Sitting   Lower Body Dressing: Min guard;Minimal assistance;Sitting/lateral leans;Sit to/from stand   Toilet Transfer: Min guard;Ambulation;Comfort height toilet;RW   Toileting- Clothing Manipulation and Hygiene: Min guard;Minimal assistance;Sitting/lateral lean;Sit to/from stand   Tub/ Shower Transfer: Walk-in shower;Min guard;Ambulation;3 in 1;Rolling walker   Functional mobility during ADLs: Min guard;Rolling walker General ADL Comments: Pt completed bed mobility, toilet transfer, in-room functional mobility, adjusted R sock by raising R foot up to access.     Vision         Perception     Praxis      Pertinent Vitals/Pain Pain Assessment: Faces Faces Pain Scale: Hurts  whole lot Pain Location: L hip and low back/sacral area extending distally. 8/10 at end of mobility, improved a fair amount once returned to supine position Pain Descriptors / Indicators: Tingling;Sharp;Guarding;Grimacing Pain Intervention(s): Limited activity within patient's tolerance;Monitored  during session;Premedicated before session;Repositioned     Hand Dominance Right   Extremity/Trunk Assessment Upper Extremity Assessment Upper Extremity Assessment: Overall WFL for tasks assessed   Lower Extremity Assessment Lower Extremity Assessment: Defer to PT evaluation   Cervical / Trunk Assessment Cervical / Trunk Assessment: Other exceptions(able to achieve WFL trunk extension, off-loads to R side )   Communication Communication Communication: No difficulties   Cognition Arousal/Alertness: Awake/alert Behavior During Therapy: WFL for tasks assessed/performed Overall Cognitive Status: Within Functional Limits for tasks assessed                                     General Comments  Pt trialed sitting up in recliner but unable to tolerate for more than a couple of minutes 2/2 increased pain.     Exercises     Shoulder Instructions      Home Living Family/patient expects to be discharged to:: Private residence Living Arrangements: Spouse/significant other Available Help at Discharge: Family;Available PRN/intermittently Type of Home: House Home Access: Stairs to enter [employer] of Steps: 3 Entrance Stairs-Rails: Right Home Layout: One level     Bathroom Shower/Tub: Tub/shower unit;Walk-in shower   Bathroom Toilet: Standard     Home Equipment: None          Prior Functioning/Environment Level of Independence: Independent        Comments: Drives.        OT Problem List: Impaired balance (sitting and/or standing);Decreased knowledge of use of DME or AE;Decreased knowledge of precautions;Pain      OT Treatment/Interventions: Self-care/ADL training;DME and/or AE instruction;Therapeutic exercise;Therapeutic activities;Patient/family education;Balance training    OT Goals(Current goals can be found in the care plan section) Acute Rehab OT Goals Patient Stated Goal: reduce pain, return to PLOF OT Goal Formulation: With  patient Time For Goal Achievement: 05/14/18 Potential to Achieve Goals: Good ADL Goals Pt Will Perform Lower Body Bathing: with modified independence;sit to/from stand Pt Will Perform Lower Body Dressing: with modified independence;sit to/from stand Pt Will Transfer to Toilet: with modified independence;ambulating Pt Will Perform Toileting - Clothing Manipulation and hygiene: with modified independence;sit to/from stand Pt Will Perform Tub/Shower Transfer: with modified independence;ambulating;3 in 1  OT Frequency: Min 2X/week   Barriers to D/C:            Co-evaluation PT/OT/SLP Co-Evaluation/Treatment: Yes Reason for Co-Treatment: To address functional/ADL transfers;For patient/therapist safety   OT goals addressed during session: ADL's and self-care      AM-PAC PT "6 Clicks" Daily Activity     Outcome Measure Help from another person eating meals?: None Help from another person taking care of personal grooming?: None Help from another person toileting, which includes using toliet, bedpan, or urinal?: A Little Help from another person bathing (including washing, rinsing, drying)?: A Little Help from another person to put on and taking off regular upper body clothing?: None Help from another person to put on and taking off regular lower body clothing?: A Little 6 Click Score: 21   End of Session Equipment Utilized During Treatment: Rolling walker Nurse Communication: Mobility status;Patient requests pain meds  Activity Tolerance: Patient tolerated treatment well;Patient limited by pain Patient left: in bed;with call bell/phone within  reach  OT Visit Diagnosis: Pain;Other abnormalities of gait and mobility (R26.89) Pain - Right/Left: Left Pain - part of body: Leg                Time: 4540-9811 OT Time Calculation (min): 25 min Charges:  OT General Charges $OT Visit: 1 Visit OT Evaluation $OT Eval Low Complexity: 1 Low  Raynald Kemp, OT Acute Rehabilitation  Services Pager: [phone] Office: [phone]  Pilar Grammes 05/07/2018, 12:37 PM

## 2018-05-07 NOTE — Discharge Summary (Addendum)
Name: Brett Graves MRN: [id number] DOB: April 23, 1991 27 y.o. PCP: Patient, No Pcp Per  Date of Admission: 05/05/2018  6:57 PM Date of Discharge: 05/08/18 Attending Physician: Anne Shutter, MD  Discharge Diagnosis: Principal Problem:   Lumbar disc herniation with radiculopathy  Discharge Medications: Allergies as of 05/08/2018   No Known Allergies     Medication List    STOP taking these medications   diclofenac 75 MG EC tablet Commonly known as:  VOLTAREN     TAKE these medications   cyclobenzaprine 10 MG tablet Commonly known as:  FLEXERIL Take 1 tablet (10 mg total) by mouth 3 (three) times daily.   ibuprofen 200 MG tablet Commonly known as:  ADVIL,MOTRIN Take 600 mg by mouth every 6 (six) hours as needed (for back pain).   omeprazole 20 MG tablet Commonly known as:  PRILOSEC OTC Take 20 mg by mouth daily as needed (for indigestion or reflux).            Durable Medical Equipment  (From admission, onward)         Start     Ordered   05/08/18 1218  DME 3-in-1  Once     05/08/18 1218   05/08/18 1218  For home use only DME Walker rolling  [facility])  Once    Question:  Patient needs a walker to treat with the following condition  Answer:  Lumbar back pain   05/08/18 1218         Disposition and follow-up:   Brett Graves was discharged from [facility] in Stable condition.  At the hospital follow up visit please address:  1.  Acute on chronic low back pain:  Patient with chronic degenerative disk disorders.   Please evaluate for any neurologic deficit or worsening of pain.  2. Positive family history of back pain. (Probable disc herniation but unclear). Sacroiliac joint was intact on plain x-ray.  If no clinical evidence in future, please do further work-up for AS or other spondyloarthropathies.   3.  Patient with incidental finding of ALT.with LFT abnormalities on previous lab 4 years ago.  Has mild hepatomegaly  on exam. Hep panel negative thus far, will recommend liver ultrasound outpatient  2.  Labs / imaging needed at time of follow-up: RUQ ultrasound  3.  Pending labs/ test needing follow-up: Hepatitis panel  Follow-up Appointments: Follow-up Information    Coletta Memos, MD. Call today.   Specialty:  Neurosurgery Why:  after your injection.  Contact information: [address] [place] [address] [phone]        [facility] Follow up.   Contact information: [address] [place] [address] [phone]       [facility]. Call.   Specialty:  Orthopedic Surgery Contact information: [address] [place] [address] [phone]          Hospital Course by problem list:  Acute on Chronic Lumbar Back Pain. Brett Graves is a 27 y.o male with chronic progressive lumbar back pain who presented to the ED with worsening lumbar back pain after a mechanical injury. Associated symptoms include radiculopathy radiating down the left leg and the patient was unable to ambulate without assistance. He was subsequently admitted for pain control. His pain responded well to NSAID therapy and muscle relaxer therapy. MRI was obtained that illustrated L4-S1 disc herniation but neurosurgery felt that it was unlikely to be causing his acute  pain. Neurosurgery recommended outpatient follow-up with orthopedic surgery for repeat steroid injection then follow-up with them if the pain does not improve. The patient was discharged in stable condition.   Discharge Vitals:   BP 130/87 (BP Location: Right Arm)   Pulse 78   Temp 97.7 F (36.5 C) (Oral)   Resp 18   Ht 6\' 4"  (1.93 m)   Wt 113.4 kg   SpO2 100%   BMI 30.43 kg/m   Pertinent Labs, Studies, and Procedures:   MRI Lumbar Spine w/out Contrast  IMPRESSION: 1. Broad left subarticular disc protrusion at L5-S1,  closely approximating and potentially affecting the descending left S1 nerve root. Finding could certainly result in left lower extremity radicular symptoms. 2. Right extraforaminal and subarticular disc protrusions at L4-5, potentially affecting either the right L4 or descending L5 nerve roots respectively. 3. Mild disc bulge at L3-4 with borderline spinal stenosis, stable.  Discharge Instructions: Discharge Instructions    Diet - low sodium heart healthy   Complete by:  As directed    Increase activity slowly   Complete by:  As directed      Signed: Levora Dredge, MD 05/08/2018, 12:25 PM    Internal Medicine Attending Note:  I saw and examined the patient on the day of discharge. I reviewed and agree with the discharge summary written by the house staff.  Jessy Oto, M.D., Ph.D.

## 2018-05-07 NOTE — Progress Notes (Signed)
Subjective: Patient was seen and evaluated at bedside on morning rounds. Parents are present. No acute events overnight.  He mentions that his back pain improved, however he has not left his bed yet.  Talk to the patient and family current medical plan and their questions were addressed.  Objective:  Vital signs in last 24 hours: Vitals:   05/06/18 1930 05/06/18 1957 05/06/18 2100 05/07/18 0524  BP: (!) 115/55 (!) 141/80  (!) 115/57  Pulse: (!) 108 100  67  Resp: 14 16  16   Temp:  99.3 F (37.4 C)  97.6 F (36.4 C)  TempSrc:  Oral  Oral  SpO2: 98% 99%  97%  Weight:   113.4 kg   Height:   6\' 4"  (1.93 m)    Physical Exam  Constitutional: He is oriented to person, place, and time. He appears well-developed and well-nourished. No distress.  HENT:  Head: Normocephalic and atraumatic.  Eyes: EOM are normal.  Cardiovascular: Normal rate, regular rhythm and normal heart sounds.  No murmur heard. Pulmonary/Chest: Effort normal and breath sounds normal. No respiratory distress. He has no wheezes. He has no rales.  Abdominal: Soft. Bowel sounds are normal. He exhibits no distension. There is mild tenderness at RUQ. Probable Hepatomegaly at 4 cm below rib cage at mid clavicular line. There is no guarding.  Musculoskeletal: He exhibits no edema, tenderness or deformity. No focal tenderness on lumbar spine or paraspinal muscles.   Neurological: He is alert and oriented to person, place, and time. No cranial nerve deficit. SLR normal today. Skin: Skin is warm and dry. No rash noted.  Psychiatric: He has a normal mood and affect. His behavior is normal. Judgment normal.   Assessment/Plan:  Active Problems:   Lumbar disc herniation with radiculopathy  Mr. Guarino is a 27 y/o M with past medical Hx of degenerative disc disease of his lumbar spine, presented to the ED due to 4 days history of acute on chronic low back pain associated with multiple on the left leg.  Acute on chronic low back  pain: Likely secondary to muscle strain/spasm in setting of chronic degenerative disk disorders.  Lumbar spine MRI showed previous disc protrusion.  Dr.Cabbell, patient's neurosurgeon was also contacted and agreed that there is no significant pathology that can be attributed to the amount of pain he has. Sacroiliac joints was normal on plain x-ray.  However may need to sacroiliac joint MRI future if develops or symptoms concerning for AS or other  Patient given Toradol 30 mg every 6 hours, Flexeril 10 mg 3 times daily.  As well as Dilaudid as needed.  His pain has been improved.    On exam today, no neurologic deficits.  SLR is negative today. Will continue muscle relaxant, NSAIDs, pain control and monitor tomorrow to see if he can ambulate plan to discharge accordingly.  -C/w  current medication today -C/w physical therapy  -We will follow-up with neurosurgeon after discharge -Control constipation   Hyponatremia.  Patient was also found to have mild hyponatremia with sodium of 128.  Glucose was 130, apparently patient did not eat anything overnight before the labs.  He did receive 1 L of fluid in the ED. Serum Osm: 298 Urine osmolality: 891 Urinary sodium: 68 Blood glucose: 141, Hb A1c: 5.1 TSH: Nl  Patient is not dry or edematous, with hyper osmolar urine, can be SIADH.  Also can be due to dehydration? Or initial lab error for hyponatremia  Was given NaCl 1 li x  2 since admission. Na improved today to 140  -BMP tomorrow. Wont need close follow up  Leucocytosis: At 18.2 today. Afebrile and no evidence of infection. Likely secondary to steroid therapy No further work up.  LFT abnormality: Slight elevation of ALT at 64 today. Had ASTand ALT elevation on last LFT, 4 days ago. With mild hepatomegaly on exam. Otherwise normal exam and asymptomatic. No hx of excess alcohol use. Can be fatty liver.   -Hepatic panel  -Recommend U/S of liver out patient  IV fluid: None CODE STATUS.   Full DVT prophylaxis.  Lovenox Diet.  Regular.  Dispo: Anticipated discharge in approximately 1-2 days   Chevis Pretty, MD 05/07/2018, 9:27 AM Pager: [phone]

## 2018-05-08 DIAGNOSIS — R740 Nonspecific elevation of levels of transaminase and lactic acid dehydrogenase [LDH]: Secondary | ICD-10-CM | POA: Diagnosis not present

## 2018-05-08 DIAGNOSIS — M5117 Intervertebral disc disorders with radiculopathy, lumbosacral region: Secondary | ICD-10-CM | POA: Diagnosis not present

## 2018-05-08 DIAGNOSIS — G8929 Other chronic pain: Secondary | ICD-10-CM | POA: Diagnosis not present

## 2018-05-08 DIAGNOSIS — M5116 Intervertebral disc disorders with radiculopathy, lumbar region: Secondary | ICD-10-CM | POA: Diagnosis not present

## 2018-05-08 DIAGNOSIS — R160 Hepatomegaly, not elsewhere classified: Secondary | ICD-10-CM

## 2018-05-08 DIAGNOSIS — Z79899 Other long term (current) drug therapy: Secondary | ICD-10-CM

## 2018-05-08 DIAGNOSIS — R7989 Other specified abnormal findings of blood chemistry: Secondary | ICD-10-CM | POA: Diagnosis present

## 2018-05-08 DIAGNOSIS — Z791 Long term (current) use of non-steroidal anti-inflammatories (NSAID): Secondary | ICD-10-CM

## 2018-05-08 DIAGNOSIS — R945 Abnormal results of liver function studies: Secondary | ICD-10-CM

## 2018-05-08 LAB — HEPATITIS B SURFACE ANTIBODY, QUANTITATIVE: HEPATITIS B-POST: 3.2 m[IU]/mL — AB

## 2018-05-08 LAB — HEPATITIS B SURFACE ANTIGEN: HEP B S AG: NEGATIVE

## 2018-05-08 LAB — HEPATITIS B CORE ANTIBODY, IGM: Hep B C IgM: NEGATIVE

## 2018-05-08 LAB — HEPATITIS A ANTIBODY, TOTAL: HEP A TOTAL AB: NEGATIVE

## 2018-05-08 LAB — HEPATITIS C ANTIBODY: HCV Ab: <0.1 s/co ratio (ref 0.0–0.9)

## 2018-05-08 LAB — HEPATITIS B E ANTIGEN: Hep B E Ag: NEGATIVE

## 2018-05-08 LAB — HEPATITIS B E ANTIBODY: Hep B E Ab: NEGATIVE

## 2018-05-08 MED ORDER — CYCLOBENZAPRINE HCL 10 MG PO TABS: 10.0000 mg | ORAL_TABLET | Freq: Three times a day (TID) | ORAL | 0 refills | Status: DC

## 2018-05-08 MED ORDER — IBUPROFEN 600 MG PO TABS
600.0000 mg | ORAL_TABLET | Freq: Four times a day (QID) | ORAL | Status: DC | PRN
  Administered 2018-05-08: 600 mg via ORAL
  Filled 2018-05-08 (×2): qty 1

## 2018-05-08 NOTE — Progress Notes (Addendum)
Patient discharged to home. Verbalizes understanding of all discharge instructions including discharge medications and follow up MD visits. Patient provided return to work note. Awaiting arrival of 3 in 1 and rolling walker.   Patient discharged with rolling walker and 3 in 1.

## 2018-05-08 NOTE — Discharge Instructions (Signed)
Thank you for allowing [place] to provide your care. Please follow-up with Delbert Harness for for another injection, then with Dr. Orlena Sheldon at the neurosurgery office. You can take 600 mg of Ibuprofen 3-4 times a day. Please take this with food and do not take it for more than 14 days. I have written you a note for work. Please remain active but limit your physical activity (heavy lifting) for the next 4-6 weeks.   Back Pain, Adult Back pain is very common. The pain often gets better over time. The cause of back pain is usually not dangerous. Most people can learn to manage their back pain on their own. Follow these instructions at home: Watch your back pain for any changes. The following actions may help to lessen any pain you are feeling:  Stay active. Start with short walks on flat ground if you can. Try to walk farther each day.  Exercise regularly as told by your doctor. Exercise helps your back heal faster. It also helps avoid future injury by keeping your muscles strong and flexible.  Do not sit, drive, or stand in one place for more than 30 minutes.  Do not stay in bed. Resting more than 1-2 days can slow down your recovery.  Be careful when you bend or lift an object. Use good form when lifting: ? Bend at your knees. ? Keep the object close to your body. ? Do not twist.  Sleep on a firm mattress. Lie on your side, and bend your knees. If you lie on your back, put a pillow under your knees.  Take medicines only as told by your doctor.  Put ice on the injured area. ? Put ice in a plastic bag. ? Place a towel between your skin and the bag. ? Leave the ice on for 20 minutes, 2-3 times a day for the first 2-3 days. After that, you can switch between ice and heat packs.  Avoid feeling anxious or stressed. Find good ways to deal with stress, such as exercise.  Maintain a healthy weight. Extra weight puts stress on your back.  Contact a doctor if:  You have pain that does not go away  with rest or medicine.  You have worsening pain that goes down into your legs or buttocks.  You have pain that does not get better in one week.  You have pain at night.  You lose weight.  You have a fever or chills. Get help right away if:  You cannot control when you poop (bowel movement) or pee (urinate).  Your arms or legs feel weak.  Your arms or legs lose feeling (numbness).  You feel sick to your stomach (nauseous) or throw up (vomit).  You have belly (abdominal) pain.  You feel like you may pass out (faint). This information is not intended to replace advice given to you by your health care provider. Make sure you discuss any questions you have with your health care provider. Document Released: 12/11/2007 Document Revised: 11/30/2015 Document Reviewed: 10/26/2013 Elsevier Interactive Patient Education  [employer].

## 2018-05-08 NOTE — Care Management Note (Signed)
Case Management Note  Patient Details  Name: Lydia Meng MRN: [id number] Date of Birth: 1990-11-20  Subjective/Objective:                    Action/Plan: Patient does have PCP. Home health agency needs post discharge doctor . Dr Caron Presume willing to sign orders and follow patient after discharge.   Explained above to patient . Patient voiced understanding and wants to follow up with DR Helberg. Explained if he wanted a PCP closer to [place] he can call number   On insurance card and be provided with a list of in network MD's. Patient voiced understanding   Expected Discharge Date:  05/08/18               Expected Discharge Plan:  Home w Home Health Services  In-House Referral:     Discharge planning Services  CM Consult  Post Acute Care Choice:  Durable Medical Equipment, Home Health Choice offered to:  Patient  DME Arranged:  3-N-1, Walker rolling DME Agency:  [facility]  HH Arranged:  OT, PT [facility] Agency:  [facility]  Status of Service:  Completed, signed off  If discussed at Long Length of Stay Meetings, dates discussed:    Additional Comments:  Kingsley Plan, RN 05/08/2018, 1:29 PM

## 2018-05-08 NOTE — Progress Notes (Signed)
   Subjective: Patient was seen and evaluated at bedside on morning rounds. No acute events overnight.  He mentions that his back pain improved.  Was able to ambulate yesterday with minimal pain. His neurosurgeon, Dr. Franky Macho visited him and talked to him about another injection in his a spine and follow-up plan.  He feels okay to be discharged.  He asks about going back to his work . All of his questions addressed.  Objective:  Vital signs in last 24 hours: Vitals:   05/06/18 2100 05/07/18 0524 05/07/18 1552 05/07/18 2207  BP:  (!) 115/57 119/66 130/87  Pulse:  67 81 78  Resp:  16 18 18   Temp:  97.6 F (36.4 C) 98 F (36.7 C) 97.7 F (36.5 C)  TempSrc:  Oral Oral Oral  SpO2:  97% 100% 100%  Weight: 113.4 kg     Height: 6\' 4"  (1.93 m)      Physical exam: General: Young gentleman, appears well-developed and well-nourished.  Is not in any distress Head: Normocephalic and atraumatic Cardiovascular: RRR, no murmur Pulmonary/chest: Normal work of breathing, CTA bilaterally, no rale, no wheeze Abdomen: Soft and non tender.  Mild hepatomegaly Extremities: Pulses are normal and palpable bilaterally, lower extremity edema Neurological: He is alert and oriented x3, no cranial nerve deficit, normal motor and sensory exam Skin: Normal  warm.no rash Psychiatric:mood, affect, mood and behavior are normal  Assessment/Plan:  Active Problems:   Lumbar disc herniation with radiculopathy  Acute on chronic low back pain: Likely secondary to muscle strain/spasm in setting of chronic degenerative disk disorders Clinically improved, was able to start ambulation yesterday.  No neurological deficit Neurosurgery visited him last night and recommended to discharge for second injection in the lumbar spine. (And he is not a candidate for surgery). Patient will contact Dr. Kerry Kass office after injection for follow up  -Discharge home today with p.o. ibuprofen and Flexeril for 2 weeks,  and follow up with  neurosurgery recommendation   Hyponatremia. Resolved  Does not need follow-up  LFT abnormality: Slight elevation of ALT at 64 today. Had ASTand ALT elevation on last LFT, 4 days ago. With mild hepatomegaly on exam. Otherwise normal exam and asymptomatic. No hx of excess alcohol use. Can be fatty liver.   -Discharge home  -Recommend U/S of liver out patient and follow up hepatic panel result   Dispo: Anticipated discharge today  Chevis Pretty, MD 05/08/2018, 6:34 AM Pager: [phone]

## 2018-05-08 NOTE — Progress Notes (Signed)
Physical Therapy Treatment Patient Details Name: Brett Graves MRN: [id number] DOB: 05-Jun-1991 Today's Date: 05/08/2018    History of Present Illness 27 year old male with PMH of degenerative disc disease of his lumbar spine who presented with acute on chronic low back pain with left leg radiculopathy over the last 4 days. A repeat MRI of his lumbar spine showed DDD, possible L5 nerve root compression and possibly some disc herniation L4-5 on right.    PT Comments    Patient progressing slowly towards PT goals. Tolerated transfers and gait training without UE support today and Min guard assist as pt reports RW does not help symptoms/pain much. Education on positioning, using pillows for comfort, pain reduction techniques etc. Encouraged follow up with OPPT to address back pain and radicular symptoms. Pt eager to return to PLOF and to work. Activity limited today due to pain.  Will follow.   Follow Up Recommendations  Outpatient PT;Supervision for mobility/OOB     Equipment Recommendations  None recommended by PT    Recommendations for Other Services       Precautions / Restrictions Precautions Precautions: Back Precaution Booklet Issued: No Precaution Comments: Reviewed ways to offload LLE symptoms in the short term Restrictions Weight Bearing Restrictions: No    Mobility  Bed Mobility Overal bed mobility: Needs Assistance Bed Mobility: Rolling;Sidelying to Sit;Sit to Sidelying Rolling: Modified independent (Device/Increase time) Sidelying to sit: Modified independent (Device/Increase time)     Sit to sidelying: Modified independent (Device/Increase time) General bed mobility comments: No assist needed, HOB flat.   Transfers Overall transfer level: Needs assistance Equipment used: None Transfers: Sit to/from Stand Sit to Stand: Supervision         General transfer comment: Supervision for safety. Stood from [employer].  Ambulation/Gait Ambulation/Gait  assistance: Min guard Gait Distance (Feet): 100 Feet Assistive device: None Gait Pattern/deviations: Step-through pattern;Decreased stride length;Decreased weight shift to left Gait velocity: decreased   General Gait Details: Slow, mostly steady gait with decreased WB through LLE due to pain; leaning right through trunk at times to offload LLE. Pain worsened with movement.   Stairs             Wheelchair Mobility    Modified Rankin (Stroke Patients Only)       Balance Overall balance assessment: Needs assistance Sitting-balance support: Feet supported;No upper extremity supported Sitting balance-Leahy Scale: Good     Standing balance support: During functional activity;No upper extremity supported Standing balance-Leahy Scale: Fair                              Cognition Arousal/Alertness: Awake/alert Behavior During Therapy: WFL for tasks assessed/performed Overall Cognitive Status: Within Functional Limits for tasks assessed                                        Exercises      General Comments        Pertinent Vitals/Pain Pain Assessment: Faces Faces Pain Scale: Hurts whole lot Pain Location: L hip and low back/sacral area extending distally.  Pain Descriptors / Indicators: Tingling;Guarding;Grimacing;Shooting Pain Intervention(s): Monitored during session;Repositioned;Limited activity within patient's tolerance    Home Living                      Prior Function  PT Goals (current goals can now be found in the care plan section) Progress towards PT goals: Progressing toward goals    Frequency    Min 3X/week      PT Plan Current plan remains appropriate;Equipment recommendations need to be updated    Co-evaluation              AM-PAC PT "6 Clicks" Daily Activity  Outcome Measure  Difficulty turning over in bed (including adjusting bedclothes, sheets and blankets)?: None Difficulty  moving from lying on back to sitting on the side of the bed? : None Difficulty sitting down on and standing up from a chair with arms (e.g., wheelchair, bedside commode, etc,.)?: None Help needed moving to and from a bed to chair (including a wheelchair)?: None Help needed walking in hospital room?: A Little Help needed climbing 3-5 steps with a railing? : A Little 6 Click Score: 22    End of Session Equipment Utilized During Treatment: Gait belt Activity Tolerance: Patient limited by pain Patient left: in bed;with call bell/phone within reach Nurse Communication: Mobility status PT Visit Diagnosis: Difficulty in walking, not elsewhere classified (R26.2)     Time: 1610-9604 PT Time Calculation (min) (ACUTE ONLY): 18 min  Charges:  $Gait Training: 8-22 mins                     Mylo Red, PT, DPT Acute Rehabilitation Services Pager [phone] Office [phone]       Blake Divine A Lanier Ensign 05/08/2018, 9:16 AM

## 2018-05-11 DIAGNOSIS — M5136 Other intervertebral disc degeneration, lumbar region: Secondary | ICD-10-CM | POA: Diagnosis not present

## 2018-05-11 DIAGNOSIS — M5106 Intervertebral disc disorders with myelopathy, lumbar region: Secondary | ICD-10-CM | POA: Diagnosis not present

## 2018-05-11 DIAGNOSIS — M5416 Radiculopathy, lumbar region: Secondary | ICD-10-CM | POA: Diagnosis not present

## 2018-05-11 DIAGNOSIS — M545 Low back pain: Secondary | ICD-10-CM | POA: Diagnosis not present

## 2018-05-13 DIAGNOSIS — M5416 Radiculopathy, lumbar region: Secondary | ICD-10-CM | POA: Diagnosis not present

## 2018-05-13 DIAGNOSIS — M5136 Other intervertebral disc degeneration, lumbar region: Secondary | ICD-10-CM | POA: Diagnosis not present

## 2018-05-15 DIAGNOSIS — M5416 Radiculopathy, lumbar region: Secondary | ICD-10-CM | POA: Diagnosis not present

## 2018-05-15 DIAGNOSIS — M5136 Other intervertebral disc degeneration, lumbar region: Secondary | ICD-10-CM | POA: Diagnosis not present

## 2018-05-18 ENCOUNTER — Ambulatory Visit: Payer: 59

## 2018-05-18 ENCOUNTER — Other Ambulatory Visit: Payer: Self-pay

## 2018-05-18 ENCOUNTER — Encounter: Payer: Self-pay | Admitting: Internal Medicine

## 2018-05-18 ENCOUNTER — Ambulatory Visit (INDEPENDENT_AMBULATORY_CARE_PROVIDER_SITE_OTHER): Payer: 59 | Admitting: Internal Medicine

## 2018-05-18 VITALS — BP 147/91 | HR 118 | Temp 98.2°F | Ht 75.0 in | Wt 262.0 lb

## 2018-05-18 DIAGNOSIS — R945 Abnormal results of liver function studies: Principal | ICD-10-CM

## 2018-05-18 DIAGNOSIS — G8929 Other chronic pain: Secondary | ICD-10-CM | POA: Diagnosis not present

## 2018-05-18 DIAGNOSIS — M48061 Spinal stenosis, lumbar region without neurogenic claudication: Secondary | ICD-10-CM | POA: Diagnosis not present

## 2018-05-18 DIAGNOSIS — Z79899 Other long term (current) drug therapy: Secondary | ICD-10-CM

## 2018-05-18 DIAGNOSIS — R7989 Other specified abnormal findings of blood chemistry: Secondary | ICD-10-CM | POA: Diagnosis not present

## 2018-05-18 DIAGNOSIS — M5116 Intervertebral disc disorders with radiculopathy, lumbar region: Secondary | ICD-10-CM | POA: Diagnosis not present

## 2018-05-18 MED ORDER — GABAPENTIN 300 MG PO CAPS: 300.0000 mg | ORAL_CAPSULE | Freq: Every day | ORAL | 0 refills | Status: DC

## 2018-05-18 MED ORDER — CELECOXIB 200 MG PO CAPS: 200.0000 mg | ORAL_CAPSULE | Freq: Two times a day (BID) | ORAL | 0 refills | Status: DC

## 2018-05-18 MED ORDER — BACLOFEN 5 MG PO TABS: 5.0000 mg | ORAL_TABLET | Freq: Three times a day (TID) | ORAL | 0 refills | Status: DC

## 2018-05-18 NOTE — Patient Instructions (Addendum)
Thank you for coming to the clinic today. It was a pleasure to see you.   For your back pain - please switch to taking celebrex twice daily and baclofen three times daily. Start using gabapentin 300 mg every night, this can cause sedation so do not drive or operate machinery after you have taken it.  You can stop taking the ibuprofen and flexeril    FOLLOW-UP INSTRUCTIONS When: 1-2 weeks with Dr. Obie Dredge in the acute care clinic  For: follow up of your back pain  What to bring: all of your medication bottles   Please call the internal medicine center clinic if you have any questions or concerns, we may be able to help and keep you from a long and expensive emergency room wait. Our clinic and after hours phone number is [phone], the best time to call is Monday through Friday 9 am to 4 pm but there is always someone available 24/7 if you have an emergency. If you need medication refills please notify your pharmacy one week in advance and they will send [place] a request.

## 2018-05-18 NOTE — Progress Notes (Signed)
CC: here to establish care   HPI:  [name] is a 27 y.o. with PMH as listed below who presents to establish care. Please see the assessment and plans for the status of the patient chronic medical problems.   Past Medical History:  Diagnosis Date  . Lumbar back pain with radiculopathy affecting left lower extremity    Past Surgical History:  Procedure Laterality Date  . NO PAST SURGERIES     Social History   Socioeconomic History  . Marital status: Single    Spouse name: Not on file  . Number of children: Not on file  . Years of education: Not on file  . Highest education level: Not on file  Occupational History  . Not on file  Social Needs  . Financial resource strain: Not on file  . Food insecurity:    Worry: Not on file    Inability: Not on file  . Transportation needs:    Medical: Not on file    Non-medical: Not on file  Tobacco Use  . Smoking status: Never Smoker  . Smokeless tobacco: Never Used  Substance and Sexual Activity  . Alcohol use: Not Currently    Comment: 05/07/2018 "might have a drink q 3 months; if that"  . Drug use: Never  . Sexual activity: Yes  Lifestyle  . Physical activity:    Days per week: Not on file    Minutes per session: Not on file  . Stress: Not on file  Relationships  . Social connections:    Talks on phone: Not on file    Gets together: Not on file    Attends religious service: Not on file    Active member of club or organization: Not on file    Attends meetings of clubs or organizations: Not on file    Relationship status: Not on file  . Intimate partner violence:    Fear of current or ex partner: Not on file    Emotionally abused: Not on file    Physically abused: Not on file    Forced sexual activity: Not on file  Other Topics Concern  . Not on file  Social History Narrative  . Not on file   Family History  Problem Relation Age of Onset  . Lumbar disc disease Father   . Lumbar disc disease  Paternal Grandfather    Review of Systems:  Refer to history of present illness and assessment and plans for pertinent review of systems, all others reviewed and negative  Physical Exam:  Vitals:   05/18/18 1518  BP: (!) 147/91  Pulse: (!) 118  Temp: 98.2 F (36.8 C)  TempSrc: Oral  SpO2: 98%  Weight: 262 lb (118.8 kg)  Height: 6\' 3"  (1.905 m)   General; well appearing, no acute distress  Eyes: no scleral erythema or icterus  Cardiac: regular rate and rhythm, no murmur, no peripheral edema  Pulm: normal work of breathing, lungs clear to auscultation  MSK: left sacral paraspinal muscle tenderness without cervical, lumbar or sacral spine tenderness  Neuro: oriented x3, lower extremity strength 5/5 bilateral  Skin: no rashes on the exposed skin of the lower extremities or back   Psych: pleasant, conversational, normal affect   Assessment & Plan:   Acute on chronic lumbar back pain  Patient recently admit for pain control after acute exacerbation of chronic lower back pain with radiculopathy. MRI of the lumbar spine showed disc protrusions and spinal stenosis of the lumbar spine. Xrays  of the SI joints were negative. Neurosurgery evaluated and recommended outpatient steroid injection which he had last week. Yesterday he noticed some improvement in his ability to walk but continues to have progressively worsening muscle spasm the further he goes. He has noticed a new burning pain over the left greater trochanter. He also notes that the pain is made worse when he attempts to stand or sit up straight. At home he has been using flexeril three times daily but this offers no relief. He is working with physical therapy now and they are teaching him stretching exercises. He has tried ibuprofen and tylenol but this offers no relief. 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  - provided a work note extending his leave for an additional two weeks  - transition flexeril to baclofen, transition ibuprofen to celecoxib  - trial of gabapentin, discussed the sedating side effect of this medication in combination with baclofen - RTC in two weeks   Elevated LFTs  Incidentally found to have elevated LFTs in the ED during recent visit for acute on chronic lower back pain. Testing for hepatitis A, B, and C was negative. In fact he was found to be non immune to hepatitis B.  - obtain RUQ ultrasound   Preventative health  - declines influenza vaccine today   See Encounters Tab for problem based charting.  Patient discussed with Dr. Criselda Peaches

## 2018-05-18 NOTE — Assessment & Plan Note (Signed)
Incidentally found to have elevated LFTs in the ED during recent visit for acute on chronic lower back pain. Testing for hepatitis A, B, and C was negative. In fact he was found to be non immune to hepatitis B.  - obtain RUQ ultrasound

## 2018-05-18 NOTE — Assessment & Plan Note (Signed)
Patient recently admit for pain control after acute exacerbation of chronic lower back pain with radiculopathy. MRI of the lumbar spine showed disc protrusions and spinal stenosis of the lumbar spine. Xrays of the SI joints were negative. Neurosurgery evaluated and recommended outpatient steroid injection which he had last week. Yesterday he noticed some improvement in his ability to walk but continues to have progressively worsening muscle spasm the further he goes. He has noticed a new burning pain over the left greater trochanter. He also notes that the pain is made worse when he attempts to stand or sit up straight. At home he has been using flexeril three times daily but this offers no relief. He is working with physical therapy now and they are teaching him stretching exercises. He has tried ibuprofen and tylenol but this offers no relief. 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  - provided a work note extending his leave for an additional two weeks  - transition flexeril to baclofen, transition ibuprofen to celecoxib  - trial of gabapentin, discussed the sedating side effect of this medication in combination with baclofen - RTC in two weeks

## 2018-05-19 ENCOUNTER — Telehealth: Payer: Self-pay | Admitting: *Deleted

## 2018-05-19 DIAGNOSIS — M5116 Intervertebral disc disorders with radiculopathy, lumbar region: Secondary | ICD-10-CM

## 2018-05-19 DIAGNOSIS — M5136 Other intervertebral disc degeneration, lumbar region: Secondary | ICD-10-CM | POA: Diagnosis not present

## 2018-05-19 DIAGNOSIS — M5416 Radiculopathy, lumbar region: Secondary | ICD-10-CM | POA: Diagnosis not present

## 2018-05-19 NOTE — Telephone Encounter (Signed)
Amy, PT with [facility] called in requesting VO for [facility] PT 3 x this week and order for outpatient PT at [facility] outpatient in [place] starting next week. Verbal auth given for [facility] PT this week. Will send to provider who saw patient yesterday for agreement/denial. Provider will need to enter order in Epic for outpatient PT to begin next week or fax to [phone]. Kinnie Feil, RN, BSN

## 2018-05-20 NOTE — Telephone Encounter (Signed)
Thank you, I have placed an order for PT

## 2018-05-21 NOTE — Progress Notes (Signed)
Internal Medicine Clinic Attending  Case discussed with Dr. Blum at the time of the visit.  We reviewed the resident's history and exam and pertinent patient test results.  I agree with the assessment, diagnosis, and plan of care documented in the resident's note. 

## 2018-05-25 DIAGNOSIS — M5126 Other intervertebral disc displacement, lumbar region: Secondary | ICD-10-CM | POA: Diagnosis not present

## 2018-05-25 DIAGNOSIS — M5137 Other intervertebral disc degeneration, lumbosacral region: Secondary | ICD-10-CM | POA: Diagnosis not present

## 2018-05-25 DIAGNOSIS — I10 Essential (primary) hypertension: Secondary | ICD-10-CM | POA: Diagnosis not present

## 2018-05-28 ENCOUNTER — Other Ambulatory Visit: Payer: Self-pay

## 2018-05-28 ENCOUNTER — Ambulatory Visit (HOSPITAL_COMMUNITY): Payer: 59 | Attending: Internal Medicine

## 2018-05-28 ENCOUNTER — Encounter (HOSPITAL_COMMUNITY): Payer: Self-pay

## 2018-05-28 DIAGNOSIS — M6281 Muscle weakness (generalized): Secondary | ICD-10-CM | POA: Insufficient documentation

## 2018-05-28 DIAGNOSIS — M5416 Radiculopathy, lumbar region: Secondary | ICD-10-CM | POA: Diagnosis not present

## 2018-05-28 DIAGNOSIS — R2689 Other abnormalities of gait and mobility: Secondary | ICD-10-CM | POA: Diagnosis not present

## 2018-05-28 NOTE — Therapy (Signed)
[facility] [address] [place], [place], [address] Phone: [phone]   Fax:  [phone]  Physical Therapy Evaluation  Patient Details  Name: Brett Graves MRN: [id number] Date of Birth: Apr 25, 1991 Referring Provider (PT): Eulah PontBlum, Nina, MD   Encounter Date: 05/28/2018  PT End of Session - 05/28/18 1645    Visit Number  1    Number of Visits  13    Date for PT Re-Evaluation  07/09/18   mini-reassess 06/18/18   Authorization Type  [employer] (no auth required, 60 visits - PT/OT/SLP)    Authorization Time Period  05/28/2018-07/10/2018    Authorization - Visit Number  1    Authorization - Number of Visits  60    PT Start Time  1000   pt late   PT Stop Time  1032    PT Time Calculation (min)  32 min    Activity Tolerance  Patient tolerated treatment well    Behavior During Therapy  [facility] for tasks assessed/performed       Past Medical History:  Diagnosis Date  . Lumbar back pain with radiculopathy affecting left lower extremity     Past Surgical History:  Procedure Laterality Date  . NO PAST SURGERIES      There were no vitals filed for this visit.   Subjective Assessment - 05/28/18 1002    Subjective  Patient reports he has been having pain on/off since April 2019. He states he thinks it started after he returned to work [place]. His pain started to worsen greatly about 3 weeks ago and there was an instance when he stood up from sitting and had sudden pain in his back and down his leg. He states that for the 3 days following this he could hardly move and had trouble rolling over in bed as well as walking. [facility] and was admitted due to the severe pain limiting his ability to walk. He has gradually improved in ability to walk over the last 2 weeks and states he has been following up with Dr. Mikey Graves and Dr. Obie Graves and his next appointment with them is 06/01/18. He is  planning to return to work on the Monday after Thanksgiving, but is concerned about walking for 12 hours a day and being able to lift. He states ~1 year ago he was much more active with going to the gym and running and would like to be able to return to that activity level again.     Limitations  Lifting;Walking;Standing;House hold activities    How long can you sit comfortably?  after a while aggravating and need to lean to rigtside because that relieves it, sit ofr 1 hour will take time to loosen up    How long can you stand comfortably?  15 minutes    How long can you walk comfortably?  15 minutes    Diagnostic tests  Pt reports MD's have told him 1-2 nerves are being compressed and that he has bulging disc on his Rt lower back    Patient Stated Goals  to bea active    Currently in Pain?  Yes    Pain Score  4     Pain Location  Back    Pain Orientation  Lower;Left    Pain Descriptors / Indicators  Burning;Sharp    Pain Type  Chronic pain    Pain Radiating Towards  sharp pain down left  leg    Pain Onset  More than a month ago   April 2019   Pain Frequency  Intermittent    Aggravating Factors   walking, sitting for extended periods, lifting,     Pain Relieving Factors  rest    Effect of Pain on Daily Activities  not able to work right now        [facility] PT Assessment - 05/28/18 0001      Assessment   Medical Diagnosis  Low Back Pain    Referring Provider (PT)  Eulah Pont, MD    Onset Date/Surgical Date  --   April 2019   Hand Dominance  Right    Next MD Visit  06/01/2018    Prior Therapy  none      Precautions   Precautions  None      Restrictions   Weight Bearing Restrictions  No      Balance Screen   Has the patient fallen in the past 6 months  No    Has the patient had a decrease in activity level because of a fear of falling?   No    Is the patient reluctant to leave their home because of a fear of falling?   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 He lifts anywehre form 25-50lbs alone and up to 75lbs with some assistance      Cognition   Overall Cognitive Status  Within Functional Limits for tasks assessed      Observation/Other Assessments   Focus on Therapeutic Outcomes (FOTO)   take next session      Functional Tests   Functional tests  Squat;Other      Squat   Comments  10 reps: early heel rise, forward trunk, anterior knee translation      Other:   Other/ Comments  Lifting: 18lbs in box, floor to waist, chest, and overhead height. Patient with good squat depth but poor mechanics for lifting and reported back pain following activity.      Posture/Postural Control   Posture/Postural Control  Postural limitations    Postural Limitations  Rounded Shoulders;Forward head;Decreased lumbar lordosis;Increased thoracic kyphosis      ROM / Strength   AROM / PROM / Strength  AROM;Strength      AROM   AROM Assessment Site  Lumbar    Lumbar Flexion  40    Lumbar Extension  18   pain   Lumbar - Right Side Bend  25    Lumbar - Left Side Bend  25    Lumbar - Right Rotation  8   pain   Lumbar - Left Rotation  8      Strength   Strength Assessment Site  Knee;Hip;Ankle    Right Hip Flexion  5/5    Right Hip Extension  5/5    Right Hip ABduction  5/5    Left Hip Flexion  4/5    Left Hip Extension  4/5    Left Hip ABduction  4-/5    Right/Left Knee  Right;Left    Right Knee Flexion  5/5    Right Knee Extension  5/5    Left Knee Flexion  4/5  Left Knee Extension  4+/5    Right Ankle Dorsiflexion  5/5    Right Ankle Plantar Flexion  5/5    Left Ankle Dorsiflexion  4+/5    Left Ankle Plantar Flexion  4/5        Objective measurements completed on examination: See above findings.     PT Education - 05/28/18 1659     Education Details  Educated patient on exam findings and plan to finish assessment's next visit. Educated on appropriate plan of care and initial HEP.    Person(s) Educated  Patient    Methods  Explanation;Handout;Demonstration    Comprehension  Verbalized understanding;Returned demonstration       PT Short Term Goals - 05/28/18 1706      PT SHORT TERM GOAL #1   Title  Patient will be independent with HEP, updated PRN, to improve lumbar mobility and LE strength to be able to tolerate 12 hour work days and lifting required to return to work without increasing his pain.     Time  2    Period  Weeks    Status  New    Target Date  06/11/18      PT SHORT TERM GOAL #2   Title  Patient will have no pain with AROM for lumbar spine indicating improved tolerance to extension and Lt lumbar intervertebral joint closing to be able to return to lefitng and extension based posture without pain.     Time  3    Period  Weeks    Status  New    Target Date  06/18/18      PT SHORT TERM GOAL #3   Title  Patient will improve LE strength in Lt LE by 1/2 grade to indicate improved leg strength to be able to ambulate with more normalized gait and improved endurance.    Time  3    Period  Weeks    Status  New        PT Long Term Goals - 05/28/18 1711      PT LONG TERM GOAL #1   Title  Patient will be able to demosntrate proper lifting mechanics for floor to varying heights with 30lb box and no pain provocation to demonstrate readiness to return to work with decrease risk for back injury.    Time  6    Period  Weeks    Status  New    Target Date  07/09/18      PT LONG TERM GOAL #2   Title  Patient will ambulate with normalized gait pattern at 1.2 m/s or faster during 2 MWT to demonstrate safe community walking speeds and imrpoved abiltiy to navigate work environment with walking demands.     Time  6    Period  Weeks    Status  New      PT LONG TERM GOAL #3   Title  Patient will report no  radicular symptoms below his knee for 1 week or greater period to indicate centralization and reduce nerve compression to further improve LE strength and reduce pain.    Time  6    Period  Weeks    Status  New        Plan - 05/28/18 1649    Clinical Impression Statement  Mr. Haggart presents for physical therapy evaluation following recent exacerbation of Lt Low back pain and weakness in LE's. He reports over the last 2 weeks his pain has improved as well as his  ability to walk, however for work he is required to stand/walk for 12 hours shifts and lift heavy equipment form 25-75 lbs. He presents with Lt LE weakness, decreased Lumbar ROM and pain with closing of Lt intervertebral joints during AROM testing. He also has pain provocation with PA's to central L3-5 process and Lt transverse process L3-5. He does have a positive slump test on Lt LE and negative for Rt LE as well. All testing indicates Mr. Pundt symptoms are related to a lumbar radiculopathy of the L3-5 vertebras. He will benefit from skilled PT interventions to address impairments and improve functional mobility to return to work safely.     Clinical Presentation  Stable    Clinical Presentation due to:  weakness, decreased ROM, pain, impaired posture, clinical judgement    Clinical Decision Making  Low    Rehab Potential  Good    PT Frequency  2x / week    PT Duration  6 weeks    PT Treatment/Interventions  ADLs/Self Care Home Management;Aquatic Therapy;Cryotherapy;Electrical Stimulation;Iontophoresis 4mg /ml Dexamethasone;Moist Heat;Traction;Gait training;Stair training;Functional mobility training;Therapeutic activities;Therapeutic exercise;Patient/family education;Balance training;Neuromuscular re-education;Manual techniques;Passive range of motion;Taping    PT Next Visit Plan  Review Eval and goals. Perform FOTO, . Initiate manual PA's to lumbar spine for L3-5 at central SP and Lt transverse process to address. Also perform  PAIVM/PPIVM to Lumabr spine on Lt for opening to reduce compression on nerve. Perofrm hip/LE strengthening for Lt LE    PT Home Exercise Plan  Eval: lumbar extension stretch    Consulted and Agree with Plan of Care  Patient       Patient will benefit from skilled therapeutic intervention in order to improve the following deficits and impairments:  Abnormal gait, Decreased activity tolerance, Decreased endurance, Decreased range of motion, Decreased strength, Pain, Improper body mechanics, Decreased mobility, Difficulty walking, Postural dysfunction, Obesity, Impaired flexibility  Visit Diagnosis: Radiculopathy, lumbar region  Muscle weakness (generalized)  Other abnormalities of gait and mobility     Problem List Patient Active Problem List   Diagnosis Date Noted  . Elevated LFTs 05/08/2018  . Lumbar disc herniation with radiculopathy 05/06/2018    Valentino Saxon, PT, DPT Physical Therapist with [facility]  05/28/2018 5:14 PM    [facility] [address] [place], [place], [address] Phone: [phone]   Fax:  [phone]  Name: Asher Torpey MRN: [id number] Date of Birth: 06-Sep-1990

## 2018-06-01 ENCOUNTER — Other Ambulatory Visit: Payer: Self-pay

## 2018-06-01 ENCOUNTER — Ambulatory Visit (HOSPITAL_COMMUNITY): Payer: 59 | Admitting: Physical Therapy

## 2018-06-01 ENCOUNTER — Ambulatory Visit (INDEPENDENT_AMBULATORY_CARE_PROVIDER_SITE_OTHER): Payer: 59 | Admitting: Internal Medicine

## 2018-06-01 ENCOUNTER — Encounter (HOSPITAL_COMMUNITY): Payer: Self-pay | Admitting: Physical Therapy

## 2018-06-01 ENCOUNTER — Encounter: Payer: Self-pay | Admitting: Internal Medicine

## 2018-06-01 VITALS — BP 129/70 | HR 90 | Temp 98.9°F | Ht 76.0 in | Wt 268.2 lb

## 2018-06-01 DIAGNOSIS — Z79899 Other long term (current) drug therapy: Secondary | ICD-10-CM

## 2018-06-01 DIAGNOSIS — M6281 Muscle weakness (generalized): Secondary | ICD-10-CM

## 2018-06-01 DIAGNOSIS — R7989 Other specified abnormal findings of blood chemistry: Secondary | ICD-10-CM | POA: Diagnosis not present

## 2018-06-01 DIAGNOSIS — M5116 Intervertebral disc disorders with radiculopathy, lumbar region: Secondary | ICD-10-CM

## 2018-06-01 DIAGNOSIS — R945 Abnormal results of liver function studies: Secondary | ICD-10-CM

## 2018-06-01 DIAGNOSIS — M5416 Radiculopathy, lumbar region: Secondary | ICD-10-CM

## 2018-06-01 DIAGNOSIS — R2689 Other abnormalities of gait and mobility: Secondary | ICD-10-CM

## 2018-06-01 MED ORDER — BACLOFEN 5 MG PO TABS: 5.0000 mg | ORAL_TABLET | Freq: Three times a day (TID) | ORAL | 0 refills | Status: AC

## 2018-06-01 MED ORDER — CELECOXIB 200 MG PO CAPS: 200.0000 mg | ORAL_CAPSULE | Freq: Two times a day (BID) | ORAL | 0 refills | Status: DC

## 2018-06-01 MED ORDER — GABAPENTIN 300 MG PO CAPS: 300.0000 mg | ORAL_CAPSULE | Freq: Every day | ORAL | 0 refills | Status: DC

## 2018-06-01 NOTE — Patient Instructions (Signed)
It was a pleasure meeting you today, Mr. Brett Graves!  For your back pain 1. No changes to the medications were made today. Continue the meds as previously.  2. Continue PT 3. I have provided a work note for the next 4 weeks  For your elevated liver enzymes 1. Liver ultrasound 2. Recheck labs in 2-3 months  Please follow-up in 4 weeks  Feel free to call our clinic at [phone] if you have any questions.  Thanks, Dr. Avie Arenasorrell

## 2018-06-01 NOTE — Progress Notes (Signed)
   CC: back pain f/u  HPI:   [name] is a 27 y.o. male with a history of lumbar disc degeneration who presents to the internal medicine clinic for follow-up of his back pain. Please see problem based charting for the history and status of the patient's current and chronic medical conditions.   Past Medical History:  Diagnosis Date  . Lumbar back pain with radiculopathy affecting left lower extremity    Review of Systems:   Pertinent positives mentioned in HPI. Remainder of all ROS negative.  Physical Exam: Vitals:   06/01/18 1350  BP: 129/70  Pulse: 90  Temp: 98.9 F (37.2 C)  TempSrc: Oral  SpO2: 99%  Weight: 268 lb 3.2 oz (121.7 kg)  Height: 6\' 4"  (1.93 m)   Physical Exam  Constitutional: Well-developed, well-nourished, and in no distress.  Eyes: Pupils are equal, round, and reactive to light. EOM are normal.  Cardiovascular: Normal rate and regular rhythm. No murmurs, rubs, or gallops. Pulmonary/Chest: Effort normal. Clear to auscultation bilaterally. No wheezes, rales, or rhonchi. Abdominal: Bowel sounds present. Soft, non-distended, non-tender. Back: No deformities. No TTP. Normal ROM. Ext: No lower extremity edema. Skin: Warm and dry. No rashes or wounds.   Assessment & Plan:   See Encounters Tab for problem based charting.  Patient seen with Dr. Sandre Kittyaines

## 2018-06-01 NOTE — Therapy (Signed)
[facility] [address] [place], [place], [address] Phone: [phone]   Fax:  [phone]  Physical Therapy Treatment  Patient Details  Name: Brett Graves MRN: [id number] Date of Birth: 20-Nov-1990 Referring Provider (PT): Eulah Pont, MD   Encounter Date: 06/01/2018  PT End of Session - 06/01/18 1008    Visit Number  2    Number of Visits  13    Date for PT Re-Evaluation  07/09/18   mini-reassess 06/18/18   Authorization Type  [employer] (no auth required, 60 visits - PT/OT/SLP)    Authorization Time Period  05/28/2018-07/10/2018    Authorization - Visit Number  2    Authorization - Number of Visits  60    PT Start Time  0945    PT Stop Time  1024    PT Time Calculation (min)  39 min    Activity Tolerance  Patient tolerated treatment well    Behavior During Therapy  [facility] for tasks assessed/performed       Past Medical History:  Diagnosis Date  . Lumbar back pain with radiculopathy affecting left lower extremity     Past Surgical History:  Procedure Laterality Date  . NO PAST SURGERIES      There were no vitals filed for this visit.  Subjective Assessment - 06/01/18 0946    Subjective  Patient reported that his back pain is a 3/10 today and stated he hasn't been taking pain medicine.     Limitations  Lifting;Walking;Standing;House hold activities    How long can you sit comfortably?  after a while aggravating and need to lean to rigtside because that relieves it, sit ofr 1 hour will take time to loosen up    How long can you stand comfortably?  15 minutes    How long can you walk comfortably?  15 minutes    Diagnostic tests  Pt reports MD's have told him 1-2 nerves are being compressed and that he has bulging disc on his Rt lower back    Patient Stated Goals  to bea active    Currently in Pain?  Yes    Pain Score  3     Pain Location  Back    Pain Orientation  Lower;Left    Pain Descriptors / Indicators   Burning    Pain Type  Chronic pain    Pain Onset  More than a month ago   April 2019        [facility] PT Assessment - 06/01/18 0001      Observation/Other Assessments   Focus on Therapeutic Outcomes (FOTO)   49% (51% limited)      Ambulation/Gait   Ambulation/Gait  Yes    Ambulation Distance (Feet)  390 Feet     Assistive device  None    Gait Pattern  Step-through pattern;Decreased stride length    Ambulation Surface  Level;Indoor    Gait velocity  0.99 m/s                   [facility] Adult PT Treatment/Exercise - 06/01/18 0001      Exercises   Exercises  Lumbar      Lumbar Exercises: Supine   Ab Set  15 reps    AB Set Limitations  5'' holds    Bent Knee Raise  20 reps;3 seconds    Bent Knee Raise Limitations  Alternating LEs with abdominal set    Bridge  15 reps  Lumbar Exercises: Sidelying   Clam  Right;Left;3 seconds      Manual Therapy   Manual Therapy  Joint mobilization    Manual therapy comments  All manual completed separately from other skilled interventions    Joint Mobilization  Grade II-III joint mobilization to central SP of lumbar L3-5 30 second oscilations for 8 minutes total for pain relief             PT Education - 06/01/18 1007    Education Details  Educated patient on purpose and technique of interventions throughout session as well as about goals.    Person(s) Educated  Patient    Methods  Explanation    Comprehension  Verbalized understanding       PT Short Term Goals - 06/01/18 1015      PT SHORT TERM GOAL #1   Title  Patient will be independent with HEP, updated PRN, to improve lumbar mobility and LE strength to be able to tolerate 12 hour work days and lifting required to return to work without increasing his pain.     Time  2    Period  Weeks    Status  On-going      PT SHORT TERM GOAL #2   Title  Patient will have no pain with AROM for lumbar spine indicating improved tolerance to extension and Lt lumbar  intervertebral joint closing to be able to return to lefitng and extension based posture without pain.     Time  3    Period  Weeks    Status  On-going      PT SHORT TERM GOAL #3   Title  Patient will improve LE strength in Lt LE by 1/2 grade to indicate improved leg strength to be able to ambulate with more normalized gait and improved endurance.    Time  3    Period  Weeks    Status  On-going        PT Long Term Goals - 06/01/18 1015      PT LONG TERM GOAL #1   Title  Patient will be able to demosntrate proper lifting mechanics for floor to varying heights with 30lb box and no pain provocation to demonstrate readiness to return to work with decrease risk for back injury.    Time  6    Period  Weeks    Status  On-going      PT LONG TERM GOAL #2   Title  Patient will ambulate with normalized gait pattern at 1.2 m/s or faster during 2 MWT to demonstrate safe community walking speeds and imrpoved abiltiy to navigate work environment with walking demands.     Time  6    Period  Weeks    Status  On-going      PT LONG TERM GOAL #3   Title  Patient will report no radicular symptoms below his knee for 1 week or greater period to indicate centralization and reduce nerve compression to further improve LE strength and reduce pain.    Time  6    Period  Weeks    Status  On-going            Plan - 06/01/18 1030    Clinical Impression Statement  This session began by reviewing patient's evaluation and goals. Then patient completed FOTO and . Educated patient on his back pain throughout these assessments. Then patient performed lower extremity and abdominal strengthening requiring verbal and tactile cues for form. Finally, ended  session with manual therapy using joint mobilizations to decrease patient's pain. Patient would benefit from continued skilled physical therapy in order to continue progressing towards functional goals.     Rehab Potential  Good    PT Frequency  2x / week     PT Duration  6 weeks    PT Treatment/Interventions  ADLs/Self Care Home Management;Aquatic Therapy;Cryotherapy;Electrical Stimulation;Iontophoresis 4mg /ml Dexamethasone;Moist Heat;Traction;Gait training;Stair training;Functional mobility training;Therapeutic activities;Therapeutic exercise;Patient/family education;Balance training;Neuromuscular re-education;Manual techniques;Passive range of motion;Taping    PT Next Visit Plan  Continue manual PA's to lumbar spine for L3-5 at central SP and Lt transverse process to address. Also perform PAIVM/PPIVM to Lumabr spine on Lt for opening to reduce compression on nerve. Perofrm hip/LE strengthening for Lt LE    PT Home Exercise Plan  Eval: lumbar extension stretch    Consulted and Agree with Plan of Care  Patient       Patient will benefit from skilled therapeutic intervention in order to improve the following deficits and impairments:  Abnormal gait, Decreased activity tolerance, Decreased endurance, Decreased range of motion, Decreased strength, Pain, Improper body mechanics, Decreased mobility, Difficulty walking, Postural dysfunction, Obesity, Impaired flexibility  Visit Diagnosis: Radiculopathy, lumbar region  Muscle weakness (generalized)  Other abnormalities of gait and mobility     Problem List Patient Active Problem List   Diagnosis Date Noted  . Elevated LFTs 05/08/2018  . Lumbar disc herniation with radiculopathy 05/06/2018   Verne CarrowMacy Kriston Pasquarello PT, DPT 10:32 AM, 06/01/18 [phone]  [facility] [address] [place], [place], [address] Phone: [phone]   Fax:  [phone]  Name: Lenard ForthChristopher Dupre MRN: [id number] Date of Birth: 1990-09-05

## 2018-06-02 ENCOUNTER — Encounter: Payer: Self-pay | Admitting: Internal Medicine

## 2018-06-02 NOTE — Assessment & Plan Note (Signed)
Mr. Brett Graves reports that his back pain is better controlled on his current regimen of baclofen, celebrex, and gabapentin. He still has back spasms and shooting pain into the left > right lower extremity when he walks or stands on hard surfaces or for long periods of time. He started working with PT last week and feels like this is already helping. He is planning on six weeks of PT. 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 There is no opportunity for restricted duties at his position. He must pass a physical evaluation before returning. He is currently on short-term disability.   Plan 1. Continue baclofen 5mg  TID, celebrex 200mg  BID, and gabapentin 300mg  qhs 2. Continue PT 3. Work note provided for 4 weeks  4. Return to clinic in 4 weeks

## 2018-06-02 NOTE — Assessment & Plan Note (Signed)
He has not had the RUQ US yet. He states his dad also has chronically elevated LFTs with negative work-up. He continues to deny abdominal pain.   Plan 1. RUQ US 2. Repeat LFTs in 2-3 months

## 2018-06-03 NOTE — Progress Notes (Signed)
Internal Medicine Clinic Attending  I saw and evaluated the patient.  I personally confirmed the key portions of the history and exam documented by Dr. Dorrell and I reviewed pertinent patient test results.  The assessment, diagnosis, and plan were formulated together and I agree with the documentation in the resident's note.  Alexander Raines, M.D., Ph.D.  

## 2018-06-11 ENCOUNTER — Encounter (HOSPITAL_COMMUNITY): Payer: Self-pay | Admitting: Physical Therapy

## 2018-06-11 ENCOUNTER — Ambulatory Visit (HOSPITAL_COMMUNITY): Payer: 59 | Attending: Internal Medicine | Admitting: Physical Therapy

## 2018-06-11 DIAGNOSIS — R2689 Other abnormalities of gait and mobility: Secondary | ICD-10-CM | POA: Diagnosis not present

## 2018-06-11 DIAGNOSIS — M6281 Muscle weakness (generalized): Secondary | ICD-10-CM | POA: Insufficient documentation

## 2018-06-11 DIAGNOSIS — M5416 Radiculopathy, lumbar region: Secondary | ICD-10-CM | POA: Diagnosis not present

## 2018-06-11 NOTE — Therapy (Signed)
[facility] [address] [place], [place], [address] Phone: [phone]   Fax:  [phone]  Physical Therapy Treatment  Patient Details  Name: Lenard ForthChristopher Tuite MRN: [id number] Date of Birth: October 21, 1990 Referring Provider (PT): Eulah PontBlum, Nina, MD   Encounter Date: 06/11/2018  PT End of Session - 06/11/18 1035    Visit Number  3    Number of Visits  13    Date for PT Re-Evaluation  07/09/18   mini-reassess 06/18/18   Authorization Type  [employer] (no auth required, 60 visits - PT/OT/SLP)    Authorization Time Period  05/28/2018-07/10/2018    Authorization - Visit Number  3    Authorization - Number of Visits  60    PT Start Time  1030    PT Stop Time  1108    PT Time Calculation (min)  38 min    Activity Tolerance  Patient tolerated treatment well    Behavior During Therapy  [facility]/per[facility]       Past Medical History:  Diagnosis Date  . Lumbar back pain with radiculopathy affecting left lower extremity     Past Surgical History:  Procedure Laterality Date  . NO PAST SURGERIES      There were no vitals filed for this visit.  Subjective Assessment - 06/11/18 1032    Subjective  Patient reported that his back pain is 3/10 today and stated the pain is going down to his left foot.     Limitations  Lifting;Walking;Standing;House hold activities    How long can you sit comfortably?  after a while aggravating and need to lean to rigtside because that relieves it, sit ofr 1 hour will take time to loosen up    How long can you stand comfortably?  15 minutes    How long can you walk comfortably?  15 minutes    Diagnostic tests  Pt reports MD's have told him 1-2 nerves are being compressed and that he has bulging disc on his Rt lower back    Patient Stated Goals  to bea active    Currently in Pain?  Yes    Pain Score  3     Pain Location  Back    Pain Orientation  Lower    Pain Descriptors / Indicators   Aching    Pain Type  Chronic pain    Pain Onset  More than a month ago   April 2019                      [facility] Adult [facility] Treatment/Exercise - 06/11/18 0001      Exercises   Exercises  Lumbar      Lumbar Exercises: Standing   Functional Squats  15 reps    Functional Squats Limitations  Cues for form    Other Standing Lumbar Exercises  Palloff press with RTB tandem stance x20      Lumbar Exercises: Seated   Hip Flexion on Ball  Strengthening;20 reps    Hip Flexion on Ball Limitations  Seated on physioball alternating LEs      Lumbar Exercises: Supine   Ab Set  15 reps    AB Set Limitations  5'' holds    Bent Knee Raise  20 reps;3 seconds    Bent Knee Raise Limitations  Alternating LEs with abdominal set    Bridge  15 reps    Bridge Limitations  5'' holds  Lumbar Exercises: Sidelying   Clam  Right;Left;3 seconds;15 reps      Lumbar Exercises: Quadruped   Single Arm Raise  20 reps    Single Arm Raises Limitations  Alternating sides    Straight Leg Raise  20 reps    Straight Leg Raises Limitations  Alternating sides      Manual Therapy   Manual Therapy  Joint mobilization;Passive ROM    Manual therapy comments  All manual completed separately from other skilled interventions    Joint Mobilization  Grade II-III joint mobilization to central SP of lumbar L3-5 30 second oscilations for 4 minutes total for pain relief    Passive ROM  Nerve glide left sciatic nerve x 10, 10 second holds             PT Education - 06/11/18 1033    Education Details  Educated patient on purpose and technique of exercises throughout session.     Person(s) Educated  Patient    Methods  Explanation;Tactile cues;Verbal cues;Demonstration    Comprehension  Verbalized understanding       PT Short Term Goals - 06/01/18 1015      PT SHORT TERM GOAL #1   Title  Patient will be independent with HEP, updated PRN, to improve lumbar mobility and LE strength to be able to  tolerate 12 hour work days and lifting required to return to work without increasing his pain.     Time  2    Period  Weeks    Status  On-going      PT SHORT TERM GOAL #2   Title  Patient will have no pain with AROM for lumbar spine indicating improved tolerance to extension and Lt lumbar intervertebral joint closing to be able to return to lefitng and extension based posture without pain.     Time  3    Period  Weeks    Status  On-going      PT SHORT TERM GOAL #3   Title  Patient will improve LE strength in Lt LE by 1/2 grade to indicate improved leg strength to be able to ambulate with more normalized gait and improved endurance.    Time  3    Period  Weeks    Status  On-going        PT Long Term Goals - 06/01/18 1015      PT LONG TERM GOAL #1   Title  Patient will be able to demosntrate proper lifting mechanics for floor to varying heights with 30lb box and no pain provocation to demonstrate readiness to return to work with decrease risk for back injury.    Time  6    Period  Weeks    Status  On-going      PT LONG TERM GOAL #2   Title  Patient will ambulate with normalized gait pattern at 1.2 m/s or faster during 2 MWT to demonstrate safe community walking speeds and imrpoved abiltiy to navigate work environment with walking demands.     Time  6    Period  Weeks    Status  On-going      PT LONG TERM GOAL #3   Title  Patient will report no radicular symptoms below his knee for 1 week or greater period to indicate centralization and reduce nerve compression to further improve LE strength and reduce pain.    Time  6    Period  Weeks    Status  On-going  Plan - 06/11/18 1112    Clinical Impression Statement  This session continued with established plan of care. Progressed lumbar and abdominal strengthening this session with quadruped exercises, palloff press, and seated marching on physioball. This session added nerve glide on the left sciatic nerve. Plan to  continue with progression of abdominal/lumbar strengthening, functional lower extremity strengthening and manual therapy as needed.     Rehab Potential  Good    PT Frequency  2x / week    PT Duration  6 weeks    PT Treatment/Interventions  ADLs/Self Care Home Management;Aquatic Therapy;Cryotherapy;Electrical Stimulation;Iontophoresis 4mg /ml Dexamethasone;Moist Heat;Traction;Gait training;Stair training;Functional mobility training;Therapeutic activities;Therapeutic exercise;Patient/family education;Balance training;Neuromuscular re-education;Manual techniques;Passive range of motion;Taping    PT Next Visit Plan  Progress quadruped to opposite arm/leg raise. Update HEP. Continue manual PA's to lumbar spine for L3-5 at central SP and Lt transverse process to address. Also perform PAIVM/PPIVM to Lumabr spine on Lt for opening to reduce compression on nerve. Perofrm hip/LE strengthening for Lt LE    PT Home Exercise Plan  Eval: lumbar extension stretch    Consulted and Agree with Plan of Care  Patient       Patient will benefit from skilled therapeutic intervention in order to improve the following deficits and impairments:  Abnormal gait, Decreased activity tolerance, Decreased endurance, Decreased range of motion, Decreased strength, Pain, Improper body mechanics, Decreased mobility, Difficulty walking, Postural dysfunction, Obesity, Impaired flexibility  Visit Diagnosis: Radiculopathy, lumbar region  Muscle weakness (generalized)  Other abnormalities of gait and mobility     Problem List Patient Active Problem List   Diagnosis Date Noted  . Elevated LFTs 05/08/2018  . Lumbar disc herniation with radiculopathy 05/06/2018   Verne Carrow PT, DPT 11:14 AM, 06/11/18 [phone]  [facility] [address] [place], [place], [address] Phone: [phone]   Fax:  [phone]  Name: Lake Cinquemani MRN: [id number] Date of Birth:  1990-12-03

## 2018-06-15 ENCOUNTER — Ambulatory Visit (HOSPITAL_COMMUNITY): Payer: 59

## 2018-06-15 ENCOUNTER — Telehealth (HOSPITAL_COMMUNITY): Payer: Self-pay | Admitting: Internal Medicine

## 2018-06-15 NOTE — Telephone Encounter (Signed)
06/15/18  pt left a message that something really bad has come up so he couldn't come today

## 2018-06-16 ENCOUNTER — Encounter (HOSPITAL_COMMUNITY): Payer: 59 | Admitting: Physical Therapy

## 2018-06-18 ENCOUNTER — Ambulatory Visit (HOSPITAL_COMMUNITY): Payer: 59

## 2018-06-18 ENCOUNTER — Encounter (HOSPITAL_COMMUNITY): Payer: Self-pay

## 2018-06-18 DIAGNOSIS — R2689 Other abnormalities of gait and mobility: Secondary | ICD-10-CM

## 2018-06-18 DIAGNOSIS — M5416 Radiculopathy, lumbar region: Secondary | ICD-10-CM | POA: Diagnosis not present

## 2018-06-18 DIAGNOSIS — M6281 Muscle weakness (generalized): Secondary | ICD-10-CM

## 2018-06-18 NOTE — Therapy (Signed)
[facility] [address] [place], [place], [address] Phone: [phone]   Fax:  [phone]  Physical Therapy Treatment  Patient Details  Name: Brett Graves MRN: [id number] Date of Birth: 26-Jul-1990 Referring Provider (PT): Eulah Pont, MD   Encounter Date: 06/18/2018  PT End of Session - 06/18/18 0919    Visit Number  4    Number of Visits  13    Date for PT Re-Evaluation  07/09/18   Minireassess 06/18/18   Authorization Type  [employer] (no auth required, 60 visits - PT/OT/SLP)    Authorization Time Period  05/28/2018-07/10/2018    Authorization - Visit Number  4    Authorization - Number of Visits  60    PT Start Time  0904    PT Stop Time  0943    PT Time Calculation (min)  39 min    Activity Tolerance  Patient tolerated treatment well    Behavior During Therapy  [facility] for tasks assessed/performed       Past Medical History:  Diagnosis Date  . Lumbar back pain with radiculopathy affecting left lower extremity     Past Surgical History:  Procedure Laterality Date  . NO PAST SURGERIES      There were no vitals filed for this visit.  Subjective Assessment - 06/18/18 0901    Subjective  Pt stated he completed prone press up and increased pain and shooting pain down both legs to feet.  Current radicular symptoms Lt LE down to knee.  Current pain scale 5/10    Patient Stated Goals  to bea active    Currently in Pain?  Yes    Pain Score  5     Pain Location  Back    Pain Orientation  Lower;Left   with standing   Pain Descriptors / Indicators  Sharp    Pain Type  Chronic pain    Pain Radiating Towards  Left leg    Pain Onset  More than a month ago    Pain Frequency  Intermittent   standing or sitting on Lt LE   Aggravating Factors   walking, sitting for extended periods, lifting, standing    Pain Relieving Factors  rest, leaning onto Rt side    Effect of Pain on Daily Activities  not able to work                        [facility] Adult PT Treatment/Exercise - 06/18/18 0001      Lumbar Exercises: Stretches   Single Knee to Chest Stretch  1 rep;30 seconds    Figure 4 Stretch  1 rep;30 seconds      Lumbar Exercises: Standing   Functional Squats  15 reps    Functional Squats Limitations  Cues for form    Lifting  From floor;5 reps    Lifting Limitations  cueing for mechanics, lifting 8# box    Other Standing Lumbar Exercises  Palloff press with RTB tandem stance x20    Other Standing Lumbar Exercises  sidestep 2RT RTB      Lumbar Exercises: Supine   Ab Set  15 reps    AB Set Limitations  5'' holds    Bent Knee Raise  20 reps;3 seconds    Bent Knee Raise Limitations  Alternating LEs with abdominal set    Bridge  10 reps   2 setsx 5" holds with ab set   Bridge Limitations  5'' holds      Lumbar Exercises: Sidelying   Clam  Right;Left;3 seconds;15 reps    Clam Limitations  with RTB      Lumbar Exercises: Quadruped   Opposite Arm/Leg Raise  Right arm/Left leg;Left arm/Right leg;10 reps;3 seconds      Manual Therapy   Manual Therapy  Neural Stretch    Manual therapy comments  All manual completed separately from other skilled interventions    Passive ROM  --    Neural Stretch  Nerve glide left sciatic nerve x 10, 10 second holds               PT Short Term Goals - 06/01/18 1015      PT SHORT TERM GOAL #1   Title  Patient will be independent with HEP, updated PRN, to improve lumbar mobility and LE strength to be able to tolerate 12 hour work days and lifting required to return to work without increasing his pain.     Time  2    Period  Weeks    Status  On-going      PT SHORT TERM GOAL #2   Title  Patient will have no pain with AROM for lumbar spine indicating improved tolerance to extension and Lt lumbar intervertebral joint closing to be able to return to lefitng and extension based posture without pain.     Time  3    Period  Weeks    Status   On-going      PT SHORT TERM GOAL #3   Title  Patient will improve LE strength in Lt LE by 1/2 grade to indicate improved leg strength to be able to ambulate with more normalized gait and improved endurance.    Time  3    Period  Weeks    Status  On-going        PT Long Term Goals - 06/01/18 1015      PT LONG TERM GOAL #1   Title  Patient will be able to demosntrate proper lifting mechanics for floor to varying heights with 30lb box and no pain provocation to demonstrate readiness to return to work with decrease risk for back injury.    Time  6    Period  Weeks    Status  On-going      PT LONG TERM GOAL #2   Title  Patient will ambulate with normalized gait pattern at 1.2 m/s or faster during 2 MWT to demonstrate safe community walking speeds and imrpoved abiltiy to navigate work environment with walking demands.     Time  6    Period  Weeks    Status  On-going      PT LONG TERM GOAL #3   Title  Patient will report no radicular symptoms below his knee for 1 week or greater period to indicate centralization and reduce nerve compression to further improve LE strength and reduce pain.    Time  6    Period  Weeks    Status  On-going            Plan - 06/18/18 0947    Clinical Impression Statement  Continued session focus iwth established POC with focus on lumbar stability and abdominal strengthening.  Progressed to quadruped opposite UE/LE for stability.  Added theraband resistance with clam and began sidestepping for glut med strengthening.  Continue with sciatic nerve glide for Lt LE.  Pt due to mini-reassess today, following discussion wiht evaluation therapist hold until  next session as have only had 3 treatments so far.      Rehab Potential  Good    PT Frequency  2x / week    PT Duration  6 weeks    PT Treatment/Interventions  ADLs/Self Care Home Management;Aquatic Therapy;Cryotherapy;Electrical Stimulation;Iontophoresis 4mg /ml Dexamethasone;Moist Heat;Traction;Gait  training;Stair training;Functional mobility training;Therapeutic activities;Therapeutic exercise;Patient/family education;Balance training;Neuromuscular re-education;Manual techniques;Passive range of motion;Taping    PT Next Visit Plan  Review compliance wiht HEP.  Continue manual PA's to lumbar spine for L3-5 at central SP and Lt transverse process to address. Also perform PAIVM/PPIVM to Lumabr spine on Lt for opening to reduce compression on nerve. Perofrm hip/LE strengthening for Lt LE    PT Home Exercise Plan  Eval: lumbar extension stretch; 12/12: bridge and clam       Patient will benefit from skilled therapeutic intervention in order to improve the following deficits and impairments:  Abnormal gait, Decreased activity tolerance, Decreased endurance, Decreased range of motion, Decreased strength, Pain, Improper body mechanics, Decreased mobility, Difficulty walking, Postural dysfunction, Obesity, Impaired flexibility  Visit Diagnosis: Radiculopathy, lumbar region  Muscle weakness (generalized)  Other abnormalities of gait and mobility     Problem List Patient Active Problem List   Diagnosis Date Noted  . Elevated LFTs 05/08/2018  . Lumbar disc herniation with radiculopathy 05/06/2018   Becky Sax, LPTA; CBIS [phone]  Juel Burrow 06/18/2018, 9:52 AM  [facility] [address] [place], [place], [address] Phone: [phone]   Fax:  [phone]  Name: Iram Lundberg MRN: [id number] Date of Birth: September 08, 1990

## 2018-06-18 NOTE — Patient Instructions (Addendum)
Bridging    Slowly raise buttocks from floor, keeping stomach tight. Repeat 2 times per set. Do 10 sets per session. Do 1-2 sessions per day.  http://orth.exer.us/1096   Copyright  VHI. All rights reserved.   Clam Shell 45 Degrees    Lying with hips and knees bent 45, one pillow between knees and ankles. Lift knee. Be sure pelvis does not roll backward. Do not arch back. Do 10 times, each leg, 1-2 times per day.  http://ss.exer.us/74   Copyright  VHI. All rights reserved.

## 2018-06-19 ENCOUNTER — Telehealth: Payer: Self-pay | Admitting: Internal Medicine

## 2018-06-19 NOTE — Telephone Encounter (Signed)
Rec'd a call from [facility] ([employer] and BethaniValorie RooseveltaGamble) RN Amy Lowella DellErnst requesting the pt's work request be completed as soon as possible. Restriction and Limitations needs to be addressed.  Please advise.  OV notes are required and the form has been placed in your box as well.

## 2018-06-19 NOTE — Telephone Encounter (Signed)
I looked at the form and his previous chart and this needs to be filed out by doctor dorrell who last examined him or he needs to come back in and see me. I've let Doris know. Thank you.

## 2018-06-19 NOTE — Telephone Encounter (Signed)
I can come by before lunch.

## 2018-06-23 ENCOUNTER — Ambulatory Visit (HOSPITAL_COMMUNITY): Payer: 59

## 2018-06-23 DIAGNOSIS — M5416 Radiculopathy, lumbar region: Secondary | ICD-10-CM

## 2018-06-23 DIAGNOSIS — M6281 Muscle weakness (generalized): Secondary | ICD-10-CM

## 2018-06-23 DIAGNOSIS — R2689 Other abnormalities of gait and mobility: Secondary | ICD-10-CM

## 2018-06-23 NOTE — Therapy (Addendum)
[facility] [address] [place], [place], [address] Phone: [phone]   Fax:  [phone]  Physical Therapy Treatment  Patient Details  Name: Brett Graves MRN: [id number] Date of Birth: 10-19-1990 Referring Provider (Brett Graves): Eulah PontBlum, Nina, MD   Encounter Date: 06/23/2018  Brett Graves End of Session - 06/23/18 0905    Visit Number  5    Number of Visits  13    Date for Brett Graves Re-Evaluation  07/09/18   Minireassess 06/18/18   Authorization Type  [employer] (no auth required, 60 visits - Brett Graves/OT/SLP)    Authorization Time Period  05/28/2018-07/10/2018    Authorization - Visit Number  5    Authorization - Number of Visits  60    Brett Graves Start Time  0906    Brett Graves Stop Time  0950    Brett Graves Time Calculation (min)  44 min    Activity Tolerance  Patient tolerated treatment well    Behavior During Therapy  [facility] for tasks assessed/performed       Past Medical History:  Diagnosis Date  . Lumbar back pain with radiculopathy affecting left lower extremity     Past Surgical History:  Procedure Laterality Date  . NO PAST SURGERIES      There were no vitals filed for this visit.  Subjective Assessment - 06/23/18 0905    Subjective  Pain relatively ok in the mornings but pain progresses through the day. Pain down the left leg seems to be getting better in the am. When sitting gets pain down Lt LE.     Patient Stated Goals  to bea active    Currently in Pain?  Yes    Pain Score  5     Pain Location  Back    Pain Orientation  Left;Lower    Pain Descriptors / Indicators  Sharp;Aching    Pain Type  Chronic pain    Pain Onset  More than a month ago                       [facility] Treatment/Exercise - 06/23/18 0001      Lumbar Exercises: Stretches   Standing Extension  10 reps    Prone on Elbows Stretch  1 rep   2 minutes   Press Ups  5 reps    Other Lumbar Stretch Exercise  prone lying 2 minutes      Lumbar Exercises: Standing    Lifting  10 reps;From floor;Weights    Lifting Weights (lbs)  10#    Lifting Limitations  cueing for mechanics      Lumbar Exercises: Supine   Pelvic Tilt  10 reps    Bridge  10 reps   2 setsx 5" holds with ab set   Bridge Limitations  5'' holds      Manual Therapy   Manual Therapy  Joint mobilization;Soft tissue mobilization    Manual therapy comments  All manual completed separately from other skilled interventions    Joint Mobilization  Grade II-III joint mobilization to central  and unilateral SP of lumbar L3-5 30 second oscilations for 4 minutes total for pain relief    Soft tissue mobilization  quad lumborum, paraspinal bilateral             Brett Graves Education - 06/23/18 1039    Education Details  Educated patient on purpose and technique of exercises throughout session. Importance of centralizing pain first; body mechanics and core ab set  before movement.    Person(s) Educated  Patient    Methods  Explanation;Demonstration;Handout    Comprehension  Verbalized understanding;Need further instruction;Verbal cues required;Tactile cues required       Brett Graves Short Term Goals - 06/23/18 0905      Brett Graves SHORT TERM GOAL #1   Title  Patient will be independent with HEP, updated PRN, to improve lumbar mobility and LE strength to be able to tolerate 12 hour work days and lifting required to return to work without increasing his pain.     Time  2    Period  Weeks    Status  On-going      Brett Graves SHORT TERM GOAL #2   Title  Patient will have no pain with AROM for lumbar spine indicating improved tolerance to extension and Lt lumbar intervertebral joint closing to be able to return to lefitng and extension based posture without pain.     Time  3    Period  Weeks    Status  On-going      Brett Graves SHORT TERM GOAL #3   Title  Patient will improve LE strength in Lt LE by 1/2 grade to indicate improved leg strength to be able to ambulate with more normalized gait and improved endurance.    Time  3     Period  Weeks    Status  On-going        Brett Graves Long Term Goals - 06/23/18 0905      Brett Graves LONG TERM GOAL #1   Title  Patient will be able to demosntrate proper lifting mechanics for floor to varying heights with 30lb box and no pain provocation to demonstrate readiness to return to work with decrease risk for back injury.    Time  6    Period  Weeks    Status  On-going      Brett Graves LONG TERM GOAL #2   Title  Patient will ambulate with normalized gait pattern at 1.2 m/s or faster during 2 MWT to demonstrate safe community walking speeds and imrpoved abiltiy to navigate work environment with walking demands.     Time  6    Period  Weeks    Status  On-going      Brett Graves LONG TERM GOAL #3   Title  Patient will report no radicular symptoms below his knee for 1 week or greater period to indicate centralization and reduce nerve compression to further improve LE strength and reduce pain.    Time  6    Period  Weeks    Status  On-going            Plan - 06/23/18 0905    Clinical Impression Statement  Continued session focus with established POC with focus on lumbar stability, pain centralization and abdominal strengthening. Decrease in Lt LE symptoms with prone position, prone on elbows progression with increased stiffness in central low back. Performed pelvic tilts to reduce low back stiffness/soreness and pain overall under better control. Then went to perform body mechanics lifting with 10#. Patient progressively complained of more low back pain and shifted weight off to the left with relative left side bend of lumbar spine near superior sacrum. Brett Graves noticed patient tends to sacral sit and suggested patient read Robin McKenzie's book for postural and mechanical education. Exercises 1-4 recommended (prone, prone on elbows, prone press ups, standing lumbar extension). Brett Graves due to mini-reassess today, following discussion with evaluation therapist hold until next session as have only had 4  treatments so far.      Rehab Potential  Good    Brett Graves Frequency  2x / week    Brett Graves Duration  6 weeks    Brett Graves Treatment/Interventions  ADLs/Self Care Home Management;Aquatic Therapy;Cryotherapy;Electrical Stimulation;Iontophoresis 4mg /ml Dexamethasone;Moist Heat;Traction;Gait training;Stair training;Functional mobility training;Therapeutic activities;Therapeutic exercise;Patient/family education;Balance training;Neuromuscular re-education;Manual techniques;Passive range of motion;Taping    Brett Graves Next Visit Plan  Review compliance wiht HEP.  Continue manual PA's to lumbar spine for L3-5 at central SP and Lt transverse process to address. Also perform PAIVM/PPIVM to Lumabr spine on Lt for opening to reduce compression on nerve. Perofrm hip/LE strengthening for Lt LE    Brett Graves Home Exercise Plan  Eval: lumbar extension stretch; 12/12: bridge and clam; 06/23/2018 - prone, prone on elbows, prone press ups, standing lumbar extension    Consulted and Agree with Plan of Care  Patient       Patient will benefit from skilled therapeutic intervention in order to improve the following deficits and impairments:  Abnormal gait, Decreased activity tolerance, Decreased endurance, Decreased range of motion, Decreased strength, Pain, Improper body mechanics, Decreased mobility, Difficulty walking, Postural dysfunction, Obesity, Impaired flexibility  Visit Diagnosis: Radiculopathy, lumbar region  Muscle weakness (generalized)  Other abnormalities of gait and mobility     Problem List Patient Active Problem List   Diagnosis Date Noted  . Elevated LFTs 05/08/2018  . Lumbar disc herniation with radiculopathy 05/06/2018    Brett Dung. Hartnett-Rands, Brett Graves, Brett Graves Per Ladoris Gene [facility] #[id number] 06/23/2018, 2:52 PM  [facility] [address] [place], [place], [address] Phone: [phone]   Fax:  [phone]  Name: Brett Graves MRN: [id number] Date of Birth: 23-Aug-1990

## 2018-06-23 NOTE — Patient Instructions (Signed)
Robin McKenzie's "Seven Ways to Have a Pain Free Life." low back postural and mechanic education for longevity.

## 2018-06-25 ENCOUNTER — Ambulatory Visit (HOSPITAL_COMMUNITY): Payer: 59

## 2018-06-25 ENCOUNTER — Encounter (HOSPITAL_COMMUNITY): Payer: Self-pay

## 2018-06-25 DIAGNOSIS — M5416 Radiculopathy, lumbar region: Secondary | ICD-10-CM | POA: Diagnosis not present

## 2018-06-25 DIAGNOSIS — M6281 Muscle weakness (generalized): Secondary | ICD-10-CM

## 2018-06-25 DIAGNOSIS — R2689 Other abnormalities of gait and mobility: Secondary | ICD-10-CM

## 2018-06-25 NOTE — Therapy (Addendum)
[facility] [address] [place], [place], [address] Phone: [phone]   Fax:  [phone]  Physical Therapy Treatment/Progress Note  Patient Details  Name: Brett Graves MRN: [id number] Date of Birth: 1990-09-04 Referring Provider (PT): Eulah Pont, MD   Encounter Date: 06/25/2018   Progress Note Reporting Period 05/28/18 to 06/25/18  See note below for Objective Data and Assessment of Progress/Goals.   I agree with the objective findings and clinical assessment performed by Becky Sax, PTA. [name] has made improvements in activity tolerance and reported reduced radicular symptoms. He is progressing towards his goals but requires strengthening for hip weaknesses and further education on safe/correct body mechanics for lifting to reduce injury risk at work. He will continue to benefit from skilled PT interventions to progress towards goals.  Valentino Saxon, PT, DPT Physical Therapist with [facility]  06/29/2018 10:25 AM     PT End of Session - 06/25/18 0910    Visit Number  6    Number of Visits  13    Date for PT Re-Evaluation  07/09/18    Authorization Type  [employer] (no auth required, 60 visits - PT/OT/SLP)    Authorization Time Period  05/28/2018-07/10/2018    Authorization - Visit Number  6    Authorization - Number of Visits  60    PT Start Time  0904    PT Stop Time  0942    PT Time Calculation (min)  38 min    Activity Tolerance  Patient tolerated treatment well    Behavior During Therapy  [facility] for tasks assessed/performed       Past Medical History:  Diagnosis Date  . Lumbar back pain with radiculopathy affecting left lower extremity     Past Surgical History:  Procedure Laterality Date  . NO PAST SURGERIES      There were no vitals filed for this visit.  Subjective Assessment - 06/25/18 0906    Subjective  Pt stated he has LBP and no radicular symptoms  since last session.  Reports relief following POE.    How long can you sit comfortably?  Able to sit for an hour comfortably.  (was after a while aggravating and need to lean to rigtside because that relieves it, sit ofr 1 hour will take time to loosen up)    How long can you stand comfortably?  Able to stand for 30 minutes (was 15 minutes)    How long can you walk comfortably?  Unsure, feels he could walk for 15-30 minutes (was 15 minutes)    Patient Stated Goals  to bea active    Currently in Pain?  Yes    Pain Score  3     Pain Location  Back    Pain Orientation  Lower;Left    Pain Descriptors / Indicators  Aching;Tender    Pain Type  Chronic pain    Pain Radiating Towards  No radicular symptoms    Pain Onset  More than a month ago    Pain Frequency  Intermittent    Aggravating Factors   walking, sitting for extended periods, lifting, standing    Pain Relieving Factors  rest, leaning onto Rt side    Effect of Pain on Daily Activities  not able to work         [facility] PT Assessment - 06/25/18 0001      Assessment   Medical Diagnosis  Low Back Pain  Referring Provider (PT)  Eulah PontBlum, Nina, MD    Onset Date/Surgical Date  --   April 2019   Hand Dominance  Right    Next MD Visit  06/29/2018    Prior Therapy  none      Observation/Other Assessments   Focus on Therapeutic Outcomes (FOTO)   53% (47% limited)   was 49% (51% limited)     Functional Tests   Functional tests  Squat;Other      Squat   Comments  10 reps: early heel rise, forward trunk, anterior knee translation      Other:   Other/ Comments  Lifting: 18lbs in box, floor to waist, chest, and overhead height. Patient with good squat depth but poor mechanics for lifting and reported back pain following activity.      Posture/Postural Control   Posture/Postural Control  Postural limitations    Postural Limitations  Rounded Shoulders;Forward head;Decreased lumbar lordosis;Increased thoracic kyphosis      ROM / Strength    AROM / PROM / Strength  AROM;Strength      AROM   AROM Assessment Site  Lumbar    Lumbar Flexion  40   was 40   Lumbar Extension  20   was 18   Lumbar - Right Side Bend  knee   was 25   Lumbar - Left Side Bend  knee   was 25   Lumbar - Right Rotation  20% limited   was 8   Lumbar - Left Rotation  10% limited   was 8     Strength   Strength Assessment Site  Hip;Knee;Ankle    Right Hip Flexion  5/5    Right Hip Extension  5/5    Right Hip ABduction  5/5    Left Hip Flexion  4/5   was 4/5   Left Hip Extension  4-/5   was 4/5   Left Hip ABduction  4/5   was 4-/5   Right/Left Knee  Right;Left    Right Knee Flexion  5/5    Right Knee Extension  5/5    Left Knee Flexion  4+/5   was 4/5   Left Knee Extension  4+/5   was 4+/5   Left Ankle Dorsiflexion  4+/5   was 4+/5   Left Ankle Plantar Flexion  4+/5   was 4/5     Ambulation/Gait   Ambulation/Gait  Yes    Ambulation Distance (Feet)  590 Feet   2MWT   Assistive device  None    Gait Pattern  Step-through pattern;Decreased stride length    Ambulation Surface  Level;Indoor    Gait velocity  1.4986   was .99 m/s                  [facility] Adult PT Treatment/Exercise - 06/25/18 0001      Lumbar Exercises: Stretches   Standing Extension  10 reps    Standing Extension Limitations  Reports radicular symptoms done Lt LE following    Prone on Elbows Stretch  1 rep    Prone on Elbows Stretch Limitations  2 minutes    Press Ups Limitations  reports increased pain      Lumbar Exercises: Standing   Lifting  10 reps;From floor;Weights    Lifting Weights (lbs)  10#    Lifting Limitations  cueing for mechanics      Lumbar Exercises: Supine   Ab Set  15 reps    Pelvic Tilt  10 reps;5  seconds    Bridge  10 reps;5 seconds   2 sets   Bridge Limitations  5'' holds               PT Short Term Goals - 06/25/18 0910      PT SHORT TERM GOAL #1   Title  Patient will be independent with HEP, updated PRN, to  improve lumbar mobility and LE strength to be able to tolerate 12 hour work days and lifting required to return to work without increasing his pain.     Baseline  06/25/18: Reports compliance wiht HEP daily    Status  Achieved      PT SHORT TERM GOAL #2   Title  Patient will have no pain with AROM for lumbar spine indicating improved tolerance to extension and Lt lumbar intervertebral joint closing to be able to return to lefitng and extension based posture without pain.     Baseline  06/25/18:  Improving mobility, reports of pain with flexion and radicular symptoms wiht multiple reps down Lt LE for a few seconds following     Status  On-going      PT SHORT TERM GOAL #3   Title  Patient will improve LE strength in Lt LE by 1/2 grade to indicate improved leg strength to be able to ambulate with more normalized gait and improved endurance.    Baseline  06/25/18: see MMT    Status  On-going        PT Long Term Goals - 06/25/18 8295      PT LONG TERM GOAL #1   Title  Patient will be able to demosntrate proper lifting mechanics for floor to varying heights with 30lb box and no pain provocation to demonstrate readiness to return to work with decrease risk for back injury.    Baseline  06/25/2018: Continues to require cueing with squat mechanics    Status  On-going      PT LONG TERM GOAL #2   Title  Patient will ambulate with normalized gait pattern at 1.2 m/s or faster during 2 MWT to demonstrate safe community walking speeds and imrpoved abiltiy to navigate work environment with walking demands.     Baseline  06/25/18: gait speed at 1.5 m/s during    Status  Achieved      PT LONG TERM GOAL #3   Title  Patient will report no radicular symptoms below his knee for 1 week or greater period to indicate centralization and reduce nerve compression to further improve LE strength and reduce pain.    Baseline  06/25/18:  Reports symptoms reducing with radicular symptoms.  Pain LBP with standing  pressure following 5-10 minutes    Status  On-going            Plan - 06/25/18 1148    Clinical Impression Statement  Continued session focus with established POC with focus on lumbar stability, abdominal and proximal strengthening and centalization of pain.  Also reviewed goals this session.  Pt reports improved tolerance for sitting, standing and walking though does have pain with standing in LBP and reports reduction in frequency and intensity with radicular symptoms down Lt LE.  Pt continues to demonstrate weakness in Lt hip musculature and decreased mobility/pain especially wiht lumbar flexion.  Pt educated on importance of proper mechanics to reduce strain on lumbar muscuature with functional squats.  No reports of increased pain or radicular symptoms at EOS.  Did c/o increased pain initially following standing extension  though was resolved quickly.      Rehab Potential  Good    PT Frequency  2x / week    PT Duration  6 weeks    PT Treatment/Interventions  ADLs/Self Care Home Management;Aquatic Therapy;Cryotherapy;Electrical Stimulation;Iontophoresis 4mg /ml Dexamethasone;Moist Heat;Traction;Gait training;Stair training;Functional mobility training;Therapeutic activities;Therapeutic exercise;Patient/family education;Balance training;Neuromuscular re-education;Manual techniques;Passive range of motion;Taping    PT Next Visit Plan  Review compliance wiht HEP.  Continue manual PA's to lumbar spine for L3-5 at central SP and Lt transverse process to address. Also perform PAIVM/PPIVM to Lumabr spine on Lt for opening to reduce compression on nerve. Perofrm hip/LE strengthening for Lt LE    PT Home Exercise Plan  Eval: lumbar extension stretch; 12/12: bridge and clam; 06/23/2018 - prone, prone on elbows, prone press ups, standing lumbar extension       Patient will benefit from skilled therapeutic intervention in order to improve the following deficits and impairments:  Abnormal gait, Decreased  activity tolerance, Decreased endurance, Decreased range of motion, Decreased strength, Pain, Improper body mechanics, Decreased mobility, Difficulty walking, Postural dysfunction, Obesity, Impaired flexibility  Visit Diagnosis: Radiculopathy, lumbar region  Muscle weakness (generalized)  Other abnormalities of gait and mobility     Problem List Patient Active Problem List   Diagnosis Date Noted  . Elevated LFTs 05/08/2018  . Lumbar disc herniation with radiculopathy 05/06/2018   Becky Saxasey Taijon Vink, LPTA; CBIS [phone]  Juel BurrowCockerham, Ethylene Reznick Jo 06/25/2018, 11:56 AM  [facility] [address] [place], [place], [address] Phone: [phone]   Fax:  [phone]  Name: Lenard ForthChristopher Charlie MRN: [id number] Date of Birth: [birth year]

## 2018-06-29 ENCOUNTER — Other Ambulatory Visit: Payer: Self-pay

## 2018-06-29 ENCOUNTER — Ambulatory Visit (HOSPITAL_COMMUNITY): Payer: 59

## 2018-06-29 ENCOUNTER — Encounter: Payer: Self-pay | Admitting: Internal Medicine

## 2018-06-29 ENCOUNTER — Encounter (HOSPITAL_COMMUNITY): Payer: Self-pay

## 2018-06-29 ENCOUNTER — Ambulatory Visit (INDEPENDENT_AMBULATORY_CARE_PROVIDER_SITE_OTHER): Payer: 59 | Admitting: Internal Medicine

## 2018-06-29 DIAGNOSIS — Z79899 Other long term (current) drug therapy: Secondary | ICD-10-CM

## 2018-06-29 DIAGNOSIS — R2689 Other abnormalities of gait and mobility: Secondary | ICD-10-CM

## 2018-06-29 DIAGNOSIS — M5116 Intervertebral disc disorders with radiculopathy, lumbar region: Secondary | ICD-10-CM

## 2018-06-29 DIAGNOSIS — M6281 Muscle weakness (generalized): Secondary | ICD-10-CM

## 2018-06-29 DIAGNOSIS — M5416 Radiculopathy, lumbar region: Secondary | ICD-10-CM | POA: Diagnosis not present

## 2018-06-29 MED ORDER — GABAPENTIN 300 MG PO CAPS: 600.0000 mg | ORAL_CAPSULE | Freq: Every day | ORAL | 0 refills | Status: AC

## 2018-06-29 MED ORDER — MELOXICAM 15 MG PO TABS: 15.0000 mg | ORAL_TABLET | Freq: Every day | ORAL | 2 refills | Status: DC

## 2018-06-29 NOTE — Progress Notes (Signed)
   CC: For reevaluation of his back pain.  HPI:  [name] is a 27 y.o. with past medical history significant for lumbar disc herniation with radiculopathy came to the clinic for reevaluation of his back pain.  His back pain is improving and he is now able to walk for 1 to 2 hours.  Please see assessment and plan for his condition.  Past Medical History:  Diagnosis Date  . Lumbar back pain with radiculopathy affecting left lower extremity    Review of Systems: Negative except mentioned in his HPI.  Physical Exam:  Vitals:   06/29/18 1333  BP: 130/79  Pulse: 82  Temp: 98.4 F (36.9 C)  TempSrc: Oral  SpO2: 99%  Weight: 272 lb (123.4 kg)    General: Vital signs reviewed.  Patient is well-developed and well-nourished, in no acute distress and cooperative with exam.  Head: Normocephalic and atraumatic. Cardiovascular: RRR, S1 normal, S2 normal, no murmurs, gallops, or rubs. Pulmonary/Chest: Clear to auscultation bilaterally, no wheezes, rales, or rhonchi. Abdominal: Soft, non-tender, non-distended, BS +, no masses, organomegaly, or guarding present.  Musculoskeletal: No spinal or paraspinal tenderness along lumbar region.  No focal deficit in lower extremities. Extremities: No lower extremity edema bilaterally,  pulses symmetric and intact bilaterally. No cyanosis or clubbing. Neurological: A&O x3, Strength is normal and symmetric bilaterally, cranial nerve II-XII are grossly intact, no focal motor deficit, sensory intact to light touch bilaterally.  Skin: Warm, dry and intact. No rashes or erythema. Psychiatric: Normal mood and affect. speech and behavior is normal. Cognition and memory are normal.  Assessment & Plan:   See Encounters Tab for problem based charting.  Patient discussed with Dr. Oswaldo DoneVincent.

## 2018-06-29 NOTE — Patient Instructions (Signed)
Thank you for visiting clinic today. I am giving you a letter to go back to work in 2 weeks, please avoid lifting heavy weights and increase your activity as tolerated. Continue physical therapy. I increased the dose of gabapentin to 600 mg at bedtime. Stop taking Celebrex and start taking Mobic or meloxicam and see if that will help with your pain. Please follow-up in 4 weeks for reevaluation.

## 2018-06-29 NOTE — Progress Notes (Deleted)
Lumbar disk herniation with radiculopathy  Mr. Brett Graves was here for follow up of lumbar disc disease.  In October he was admitted for pain control after an acute exacerbation of chronic lower back pain with radiculopathy.  MRI showed disc protrusion and spinal stenosis of the lumbar spine. Neurosurgery evaluated and recommended outpatient epidural steroid injections.  He is also been managed with baclofen, Celebrex, and gabapentin and been working with physical therapy.  He has not yet been able to return to work, he works as a Games developerplant technician for [employer] and his job requires heavy lifting and long hours on his feet.  He is here for reevaluation and assessment of the safety to return to work.  Preventative health  -  flu vaccine***

## 2018-06-29 NOTE — Assessment & Plan Note (Signed)
Patient developed acute lower back pain radiating to left leg while lifting some weights and getting up from floor.  MRI shows some disc herniation.  Is on temporary disability since then as his job required long hours of standing and lifting heavy weights. Patient is undergoing physical therapy and using gabapentin, baclofen and Celebrex. According to patient physical therapy is helping with his symptoms and now he is being able to walk for 1 to 2 hours, has not tried lifting any weights yet.  His leg pain is improving but he continues to get intermittent tingling in his calves, more on left than right. Patient needs reevaluation every 4 weeks for his disability.  He would like to go back to work in 2 weeks and see how he does. Currently patient medicines are not helping with his pain so he was not taking baclofen at this time.  He continued to take gabapentin and Celebrex with not much relief.  -Increase gabapentin to 600 mg at bedtime. -Discontinue Celebrex and baclofen. -Start him on meloxicam 15 mg daily as needed. -Continue physical therapy as it seems helping. -Patient was encouraged to lose some weight as that will help with his back condition. -He was provided with a letter to go back to work in 2 weeks with lighter work and no heavy lifting. -We will reevaluate him in 4 weeks for unrestricted going back to work.

## 2018-06-29 NOTE — Therapy (Signed)
[facility] [address] [place], [place], [address] Phone: [phone]   Fax:  [phone]  Physical Therapy Treatment  Patient Details  Name: Brett Graves MRN: [id number] Date of Birth: 08/05/1990 Referring Provider (PT): Eulah Pont, MD   Encounter Date: 06/29/2018  PT End of Session - 06/29/18 1048    Visit Number  7    Number of Visits  13    Date for PT Re-Evaluation  07/09/18    Authorization Type  [employer] (no auth required, 60 visits - PT/OT/SLP)    Authorization Time Period  05/28/2018-07/10/2018    Authorization - Visit Number  7    Authorization - Number of Visits  60    PT Start Time  1034    PT Stop Time  1117    PT Time Calculation (min)  43 min    Activity Tolerance  Patient tolerated treatment well    Behavior During Therapy  [facility] for tasks assessed/performed       Past Medical History:  Diagnosis Date  . Lumbar back pain with radiculopathy affecting left lower extremity     Past Surgical History:  Procedure Laterality Date  . NO PAST SURGERIES      There were no vitals filed for this visit.  Subjective Assessment - 06/29/18 1035    Subjective  He reports he is doing his HEP a every day. He states he just started going to the gym and is riding a bike for 5-10 minutes. He states he hears "it" (his back) clicking through when he rides. He has his follow up appointment today with Dr. Tamela Oddi office.    Limitations  Lifting;Walking;Standing;House hold activities    How long can you sit comfortably?  Able to sit for an hour comfortably.  (was after a while aggravating and need to lean to rigtside because that relieves it, sit ofr 1 hour will take time to loosen up)    How long can you stand comfortably?  Able to stand for 30 minutes (was 15 minutes)    How long can you walk comfortably?  Unsure, feels he could walk for 15-30 minutes (was 15 minutes)    Patient Stated Goals  to bea active    Currently  in Pain?  No/denies        [facility] Adult PT Treatment/Exercise - 06/29/18 0001      Lumbar Exercises: Stretches   Prone on Elbows Stretch  10 seconds;Limitations    Prone on Elbows Stretch Limitations  10 reps    Press Ups  10 reps;5 seconds    Press Ups Limitations  within tolerance      Lumbar Exercises: Machines for Strengthening   Leg Press  [employer]: bil LE wtih 5 plates (69GEX), 1x 15 reps      Lumbar Exercises: Standing   Functional Squats  10 reps;Limitations    Functional Squats Limitations  2 sets, chair taps    Lifting  From 12";10 reps;Limitations    Lifting Weights (lbs)  10    Lifting Limitations  squat with 3kg ball tap on 12" box, cue for depth and increased hip flexion    Scapular Retraction  Strengthening;Both;15 reps;Theraband;Limitations    Theraband Level (Scapular Retraction)  Level 3 (Green)    Scapular Retraction Limitations  2 sets    Row  Strengthening;Both;15 reps;Theraband;Limitations    Theraband Level (Row)  Level 3 (Green)    Row Limitations  2 sets    Shoulder Extension  Strengthening;Both;15 reps;Theraband;Limitations    Theraband Level (Shoulder Extension)  Level 3 (Green)    Shoulder Extension Limitations  2 sets      Lumbar Exercises: Seated   Other Seated Lumbar Exercises  sciatic nerve glide Lt LE: 10 reps, 3 sec holds each position      Manual Therapy   Manual Therapy  Joint mobilization    Manual therapy comments  All manual completed separately from other skilled interventions    Joint Mobilization  3x 30-45 seconds oscillations at L3-5, grade II PA mobilization for pain relief   patient unable to tolerate grade III       PT Education - 06/29/18 1047    Education Details  Educated on exercise throughout and on proper mechanics/cues for squats and eductated on nerve glide.    Person(s) Educated  Patient    Methods  Explanation    Comprehension  Verbalized understanding;Returned demonstration       PT Short Term Goals - 06/25/18  0910      PT SHORT TERM GOAL #1   Title  Patient will be independent with HEP, updated PRN, to improve lumbar mobility and LE strength to be able to tolerate 12 hour work days and lifting required to return to work without increasing his pain.     Baseline  06/25/18: Reports compliance wiht HEP daily    Status  Achieved      PT SHORT TERM GOAL #2   Title  Patient will have no pain with AROM for lumbar spine indicating improved tolerance to extension and Lt lumbar intervertebral joint closing to be able to return to lefitng and extension based posture without pain.     Baseline  06/25/18:  Improving mobility, reports of pain with flexion and radicular symptoms wiht multiple reps down Lt LE for a few seconds following     Status  On-going      PT SHORT TERM GOAL #3   Title  Patient will improve LE strength in Lt LE by 1/2 grade to indicate improved leg strength to be able to ambulate with more normalized gait and improved endurance.    Baseline  06/25/18: see MMT    Status  On-going        PT Long Term Goals - 06/25/18 [id number]      PT LONG TERM GOAL #1   Title  Patient will be able to demosntrate proper lifting mechanics for floor to varying heights with 30lb box and no pain provocation to demonstrate readiness to return to work with decrease risk for back injury.    Baseline  06/25/2018: Continues to require cueing with squat mechanics    Status  On-going      PT LONG TERM GOAL #2   Title  Patient will ambulate with normalized gait pattern at 1.2 m/s or faster during 2 MWT to demonstrate safe community walking speeds and imrpoved abiltiy to navigate work environment with walking demands.     Baseline  06/25/18: gait speed at 1.5 m/s during 2MWT    Status  Achieved      PT LONG TERM GOAL #3   Title  Patient will report no radicular symptoms below his knee for 1 week or greater period to indicate centralization and reduce nerve compression to further improve LE strength and reduce pain.     Baseline  06/25/18:  Reports symptoms reducing with radicular symptoms.  Pain LBP with standing pressure following 5-10 minutes    Status  On-going  Plan - 06/29/18 1048    Clinical Impression Statement  Therapy focus continues to be on lumbar mobility and functional squat/lifting mechanics. He was unable to tolerate grade III mobilization and grade II was performed for pain relief to lumbar spine. He continued with squat trainign and lifting mechanics training, requiring cues for wider BOS and to increase hip flexion. Leg press initiated today to improve patient's hip/knee flexion with squatting and he required minimal cues to maintain foot-knee-hip aligned during exercise. He will continue to benefit from skilled PT interventions to address impairments and progress towards goals.    Rehab Potential  Good    PT Frequency  2x / week    PT Duration  6 weeks    PT Treatment/Interventions  ADLs/Self Care Home Management;Aquatic Therapy;Cryotherapy;Electrical Stimulation;Iontophoresis 4mg /ml Dexamethasone;Moist Heat;Traction;Gait training;Stair training;Functional mobility training;Therapeutic activities;Therapeutic exercise;Patient/family education;Balance training;Neuromuscular re-education;Manual techniques;Passive range of motion;Taping    PT Next Visit Plan  Continue manual PA's to lumbar spine for L3-5 at central SP and Lt transverse process to address. Perofrm hip/LE strengthening for Lt LE. Continue with squat form training and lifting technique.    PT Home Exercise Plan  Eval: lumbar extension stretch; 12/12: bridge and clam; 06/23/2018 - prone, prone on elbows, prone press ups, standing lumbar extension    Consulted and Agree with Plan of Care  Patient       Patient will benefit from skilled therapeutic intervention in order to improve the following deficits and impairments:  Abnormal gait, Decreased activity tolerance, Decreased endurance, Decreased range of motion, Decreased  strength, Pain, Improper body mechanics, Decreased mobility, Difficulty walking, Postural dysfunction, Obesity, Impaired flexibility  Visit Diagnosis: Radiculopathy, lumbar region  Muscle weakness (generalized)  Other abnormalities of gait and mobility     Problem List Patient Active Problem List   Diagnosis Date Noted  . Elevated LFTs 05/08/2018  . Lumbar disc herniation with radiculopathy 05/06/2018    Valentino Saxonachel Quinn-Brown, PT, DPT Physical Therapist with [facility]  06/29/2018 12:23 PM    [facility] [address] [place], [place], [address] Phone: [phone]   Fax:  [phone]  Name: Lenard ForthChristopher Pacer MRN: [id number] Date of Birth: Mar 25, 1991

## 2018-06-29 NOTE — Progress Notes (Signed)
Internal Medicine Clinic Attending  Case discussed with Dr. Amin at the time of the visit.  We reviewed the resident's history and exam and pertinent patient test results.  I agree with the assessment, diagnosis, and plan of care documented in the resident's note.    

## 2018-07-02 ENCOUNTER — Ambulatory Visit (HOSPITAL_COMMUNITY): Payer: 59

## 2018-07-02 ENCOUNTER — Other Ambulatory Visit: Payer: Self-pay

## 2018-07-02 ENCOUNTER — Encounter (HOSPITAL_COMMUNITY): Payer: Self-pay

## 2018-07-02 DIAGNOSIS — M5416 Radiculopathy, lumbar region: Secondary | ICD-10-CM | POA: Diagnosis not present

## 2018-07-02 DIAGNOSIS — R2689 Other abnormalities of gait and mobility: Secondary | ICD-10-CM

## 2018-07-02 DIAGNOSIS — M6281 Muscle weakness (generalized): Secondary | ICD-10-CM

## 2018-07-02 NOTE — Therapy (Signed)
[facility] [address] [place], [place], [address] Phone: [phone]   Fax:  [phone]  Physical Therapy Treatment  Patient Details  Name: Brett Graves MRN: [id number] Date of Birth: 1990/08/26 Referring Provider (PT): Eulah Pont, MD   Encounter Date: 07/02/2018  PT End of Session - 07/02/18 1036    Visit Number  8    Number of Visits  13    Date for PT Re-Evaluation  07/09/18    Authorization Type  [employer] (no auth required, 60 visits - PT/OT/SLP)    Authorization Time Period  05/28/2018-07/10/2018    Authorization - Visit Number  8    Authorization - Number of Visits  60    PT Start Time  1031    PT Stop Time  1117    PT Time Calculation (min)  46 min    Activity Tolerance  Patient tolerated treatment well    Behavior During Therapy  [facility] for tasks assessed/performed       Past Medical History:  Diagnosis Date  . Lumbar back pain with radiculopathy affecting left lower extremity     Past Surgical History:  Procedure Laterality Date  . NO PAST SURGERIES      There were no vitals filed for this visit.  Subjective Assessment - 07/02/18 1033    Subjective  Patient reports he is planning to return to work on 07/13/17 and then after 2 weeks of work he will return back to [place] for another follow up. He reports he will be on "light duty" at work, no lifting above a certain weight and time limits on how long he can be standing and walking continuously at work.     Limitations  Lifting;Walking;Standing;House hold activities    How long can you sit comfortably?  Able to sit for an hour comfortably.  (was after a while aggravating and need to lean to rigtside because that relieves it, sit ofr 1 hour will take time to loosen up)    How long can you stand comfortably?  Able to stand for 30 minutes (was 15 minutes)    How long can you walk comfortably?  Unsure, feels he could walk for 15-30 minutes (was 15 minutes)    Patient Stated Goals  to bea active    Currently in Pain?  No/denies       [facility] Adult PT Treatment/Exercise - 07/02/18 0001      Exercises   Exercises  Lumbar      Lumbar Exercises: Stretches   Prone on Elbows Stretch  10 seconds;Limitations    Prone on Elbows Stretch Limitations  10 reps    Press Ups  10 reps;5 seconds    Press Ups Limitations  within tolerance      Lumbar Exercises: Machines for Strengthening   Leg Press  [employer]: bil LE wtih 6 plates (13KGM), 2x 15 reps    Other Lumbar Machine Exercise  Squat/Lifting: pulley cable, 2x 15 reps, squat with bicep curl for lifting (20lbs/2plates)      Lumbar Exercises: Standing   Scapular Retraction  Strengthening;Both;15 reps;Theraband;Limitations    Theraband Level (Scapular Retraction)  Level 3 (Green)    Scapular Retraction Limitations  2 sets    Row  Strengthening;Both;15 reps;Theraband;Limitations    Theraband Level (Row)  Level 3 (Green)    Row Limitations  2 sets    Shoulder Extension  Strengthening;Both;15 reps;Theraband;Limitations    Theraband Level (Shoulder Extension)  Level 3 (Green)  Shoulder Extension Limitations  2 sets      Lumbar Exercises: Seated   Other Seated Lumbar Exercises  sciatic nerve glide Lt LE: 10 reps, 3 sec holds each position      Manual Therapy   Manual Therapy  Joint mobilization    Manual therapy comments  All manual completed separately from other skilled interventions    Joint Mobilization  3x 30-45 seconds oscillations at L3-5, grade II PA mobilization for pain relief        PT Education - 07/02/18 1035    Education Details  Educated on exercise throughout and to be cautious with lifting heavy weight at work. Edcuated to perform nerve glide as part of HEP for Lt LE.     Person(s) Educated  Patient    Methods  Explanation    Comprehension  Verbalized understanding       PT Short Term Goals - 06/25/18 0910      PT SHORT TERM GOAL #1   Title  Patient will be independent with  HEP, updated PRN, to improve lumbar mobility and LE strength to be able to tolerate 12 hour work days and lifting required to return to work without increasing his pain.     Baseline  06/25/18: Reports compliance wiht HEP daily    Status  Achieved      PT SHORT TERM GOAL #2   Title  Patient will have no pain with AROM for lumbar spine indicating improved tolerance to extension and Lt lumbar intervertebral joint closing to be able to return to lefitng and extension based posture without pain.     Baseline  06/25/18:  Improving mobility, reports of pain with flexion and radicular symptoms wiht multiple reps down Lt LE for a few seconds following     Status  On-going      PT SHORT TERM GOAL #3   Title  Patient will improve LE strength in Lt LE by 1/2 grade to indicate improved leg strength to be able to ambulate with more normalized gait and improved endurance.    Baseline  06/25/18: see MMT    Status  On-going        PT Long Term Goals - 06/25/18 [id number]      PT LONG TERM GOAL #1   Title  Patient will be able to demosntrate proper lifting mechanics for floor to varying heights with 30lb box and no pain provocation to demonstrate readiness to return to work with decrease risk for back injury.    Baseline  06/25/2018: Continues to require cueing with squat mechanics    Status  On-going      PT LONG TERM GOAL #2   Title  Patient will ambulate with normalized gait pattern at 1.2 m/s or faster during 2 MWT to demonstrate safe community walking speeds and imrpoved abiltiy to navigate work environment with walking demands.     Baseline  06/25/18: gait speed at 1.5 m/s during 2MWT    Status  Achieved      PT LONG TERM GOAL #3   Title  Patient will report no radicular symptoms below his knee for 1 week or greater period to indicate centralization and reduce nerve compression to further improve LE strength and reduce pain.    Baseline  06/25/18:  Reports symptoms reducing with radicular symptoms.   Pain LBP with standing pressure following 5-10 minutes    Status  On-going       Plan - 07/02/18 1036    Clinical  Impression Statement  Session continued with focus on postural strengthening and lifting mechanics. Patient able to perform prone press up with full elbow extension today and no reports of pain. He continued with exercises to encourage hip/knee flexion for squat mechanics and progressed lifting training using weighted pulley. He is planning to return to work on light duty on 07/13/18 and will benefit from further lifting education. He reported some Lt LE tingling following postural strengthening but symptoms were resolved with Lt LE sciatic nerve glide. He will continue to benefit from skilled PT interventions to address impairments and progress towards goals.    Rehab Potential  Good    PT Frequency  2x / week    PT Duration  6 weeks    PT Treatment/Interventions  ADLs/Self Care Home Management;Aquatic Therapy;Cryotherapy;Electrical Stimulation;Iontophoresis 4mg /ml Dexamethasone;Moist Heat;Traction;Gait training;Stair training;Functional mobility training;Therapeutic activities;Therapeutic exercise;Patient/family education;Balance training;Neuromuscular re-education;Manual techniques;Passive range of motion;Taping    PT Next Visit Plan  Continue manual PA's to lumbar spine for L3-5 at central SP and Lt transverse process to address. Perform hip/LE strengthening for Lt LE. Continue with squat form training and lifting technique. Follow up on sciatic nerve glide for at home.     PT Home Exercise Plan  Eval: lumbar extension stretch; 12/12: bridge and clam; 06/23/2018 - prone, prone on elbows, prone press ups, standing lumbar extension. TB postural training.     Consulted and Agree with Plan of Care  Patient       Patient will benefit from skilled therapeutic intervention in order to improve the following deficits and impairments:  Abnormal gait, Decreased activity tolerance, Decreased  endurance, Decreased range of motion, Decreased strength, Pain, Improper body mechanics, Decreased mobility, Difficulty walking, Postural dysfunction, Obesity, Impaired flexibility  Visit Diagnosis: Radiculopathy, lumbar region  Muscle weakness (generalized)  Other abnormalities of gait and mobility     Problem List Patient Active Problem List   Diagnosis Date Noted  . Elevated LFTs 05/08/2018  . Lumbar disc herniation with radiculopathy 05/06/2018    Valentino Saxonachel Quinn-Brown, PT, DPT Physical Therapist [facility]  07/02/2018 11:20 AM    [facility] [address] [place], [place], [address] Phone: [phone]   Fax:  [phone]  Name: Brett ForthChristopher Graves MRN: [id number] Date of Birth: 1991/06/26

## 2018-07-02 NOTE — Patient Instructions (Signed)
Standing Row with Anchored Resistance reps: 10-15 sets: 2 hold: 3 seconds daily: 1  weekly: 7      Exercise image step 1   Exercise image step 2  Setup  Begin in a standing upright position holding both ends of a resistance band that is anchored in front of you at chest height. Movement  Pull your arms back against the resistance, bending your elbows, then slowly return to the starting position and repeat. Tip  Make sure to keep your back straight and think of squeezing your shoulder blades together as you pull your arms back. Standing Elbow Extension with Anchored Resistance reps: 10-15 sets: 2 hold: 3 seconds daily: 1  weekly: 7      Exercise image step 1   Exercise image step 2  Setup  Begin in a standing upright position, holding the ends of a resistance band that is anchored overhead in front of you. Movement  Gently squeeze your shoulder blades together. Maintaining this position, slowly straighten your elbows, pressing your hands towards the floor, then reverse the movement, and repeat. Tip  Make sure to keep your elbows close to your sides and do not shrug your shoulders during the exercise. Disclaimer: This program provides exercises related to your condition that you can perform at home. As there is a risk of injury with any activity, use caution when performing exercises. If you experience any pain or discomfort, discontinue the exercises and contact your health care provider.  Login URL: [email or web address] Access Code: [email or web address] . Date printed: 07/02/2018 Page 1  Standing High Row with Resistance reps: 10-15 sets: 2 hold: 3 seconds daily: 1  weekly: 7      Exercise image step 1   Exercise image step 2  Setup  Setup Directions Movement  Begin in a staggered stance position, holding both ends of a resistance band that is anchored overhead in front of you.  Tip  Pull your arms back, bending your elbows, until they are parallel with your trunk. Hold, then  slowly return to the start position.

## 2018-07-09 ENCOUNTER — Ambulatory Visit (HOSPITAL_COMMUNITY): Payer: 59 | Attending: Internal Medicine

## 2018-07-09 DIAGNOSIS — R2689 Other abnormalities of gait and mobility: Secondary | ICD-10-CM | POA: Diagnosis not present

## 2018-07-09 DIAGNOSIS — M5416 Radiculopathy, lumbar region: Secondary | ICD-10-CM | POA: Insufficient documentation

## 2018-07-09 DIAGNOSIS — M6281 Muscle weakness (generalized): Secondary | ICD-10-CM | POA: Insufficient documentation

## 2018-07-09 NOTE — Therapy (Signed)
[place], [place], [address] Phone: [phone]   Fax:  [phone]  Physical Therapy Treatment/Discharge Summary  Patient Details  Name: Brett Graves MRN: [id number] Date of Birth: March 19, 1991 Referring Provider (PT): Ledell Noss, MD   Encounter Date: 07/09/2018   PHYSICAL THERAPY DISCHARGE SUMMARY  Visits from Start of Care: 9  Current functional level related to goals / functional outcomes: Re-assessment performed today and patient has made excellent progress towards goals. He has significantly improved bil LE strength and lumbar ROM is WNL's for all planes and pain free this date. He has demonstrated proper lifting mechanics with weights up to 30 lbs and has progressed functional strengthening to lifting based exercises. He has been educated on centralization of radicular symptoms and provided an advanced HEP to continue progressing independently. He has initiated independent exercise at his gym and is planning to continue performing exercises at gym to improve strength for return to work. He has stated he is comfortable with discharging to independent program at this time. He will be discharged following today's session.   Remaining deficits: See below details   Education / Equipment: Educated on progress towards goals and on readiness for discharge. Educated on updated HEP with advanced lifting technique exercises to perform at home or the gym 2-3x/week.  Plan: Patient agrees to discharge.  Patient goals were met. Patient is being discharged due to meeting the stated rehab goals.  ?????       PT End of Session - 07/09/18 1300    Visit Number  9    Number of Visits  13    Date for PT Re-Evaluation  07/09/18    Authorization Type  [employer] (no auth required, 60 visits - PT/OT/SLP)    Authorization Time Period  05/28/2018-07/10/2018    Authorization - Visit Number  9    Authorization - Number  of Visits  60    PT Start Time  1302    PT Stop Time  1341    PT Time Calculation (min)  39 min    Activity Tolerance  Patient tolerated treatment well    Behavior During Therapy  WFL for tasks assessed/performed       Past Medical History:  Diagnosis Date  . Lumbar back pain with radiculopathy affecting left lower extremity     Past Surgical History:  Procedure Laterality Date  . NO PAST SURGERIES      There were no vitals filed for this visit.  Subjective Assessment - 07/09/18 1303    Subjective  Patient reports he is no longer having shooting pains down leg however now there are localized spots of pain that are at his knee and hip. He hasn't gone to the gym the past 2 days but is planning to continue going 2-3x/week.     Limitations  Lifting;Walking;Standing;House hold activities    How long can you sit comfortably?  Able to sit for an hour comfortably.  (was after a while aggravating and need to lean to rigtside because that relieves it, sit ofr 1 hour will take time to loosen up)    How long can you stand comfortably?  Able to stand for 30 minutes (was 15 minutes)    How long can you walk comfortably?  Unsure, feels he could walk for 15-30 minutes (was 15 minutes)    Patient Stated Goals  to bea active    Currently in Pain?  Yes    Pain Score  3     Pain Location  Back    Pain Orientation  Lower;Left    Pain Descriptors / Indicators  Pressure    Pain Type  Chronic pain    Pain Onset  More than a month ago    Pain Frequency  Constant         [facility] PT Assessment - 07/09/18 0001      Assessment   Medical Diagnosis  Low Back Pain    Referring Provider (PT)  Ledell Noss, MD    Onset Date/Surgical Date  --   April 2019   Hand Dominance  Right    Next MD Visit  08/04/2018    Prior Therapy  none      Observation/Other Assessments   Focus on Therapeutic Outcomes (FOTO)   35% limited   was 51% limited 06/01/18; was 47% limited on 06/25/18     Functional Tests    Functional tests  Squat;Other      Squat   Comments  10 reps: early heel rise, forward trunk, anterior knee translation      Other:   Other/ Comments  Lifting: 3 different height with 10lbs box and 3 different heights with 30lbs box (floor to waist, chest, and overhead height). Patient with good squat depth and maintained neutral spine posture for lifting mechanics. He denied an increase in back pain following activity.      Posture/Postural Control   Posture/Postural Control  Postural limitations    Postural Limitations  Rounded Shoulders;Forward head;Decreased lumbar lordosis;Increased thoracic kyphosis      AROM   Lumbar Flexion  58   was 40   Lumbar Extension  22   was 18   Lumbar - Right Side Bend  25    Lumbar - Left Side Bend  --   was 25   Lumbar - Right Rotation  12   was 8   Lumbar - Left Rotation  12   was 8     Strength   Right Hip Flexion  5/5    Right Hip Extension  5/5    Right Hip ABduction  5/5    Left Hip Flexion  4+/5   was 4/5   Left Hip Extension  4/5   was 4/5   Left Hip ABduction  4+/5   was 4-/5   Right Knee Flexion  5/5    Right Knee Extension  5/5    Left Knee Flexion  4+/5   was 4/5   Left Knee Extension  5/5   was 4+/5   Right Ankle Dorsiflexion  5/5    Right Ankle Plantar Flexion  5/5    Left Ankle Dorsiflexion  5/5   was 4+/5   Left Ankle Plantar Flexion  5/5   was 4/5     Ambulation/Gait   Ambulation/Gait  --    Ambulation Distance (Feet)  --   2MWT   Assistive device  --    Gait Pattern  --    Gait velocity  --        [facility] Adult PT Treatment/Exercise - 07/09/18 0001      Exercises   Exercises  Lumbar      Lumbar Exercises: Machines for Strengthening   Other Lumbar Machine Exercise  Squat/Lifting: pulley cable, 2x 15 reps, squat with bicep curl for lifting (20lbs/2plates)    Other Lumbar Machine Exercise  D2 extension: with 20lbs (2 plates) pulley cable, 1x 15 reps Bil UE  Lumbar Exercises: Standing   Other  Standing Lumbar Exercises  deadlift with resistance:blue TB, 1x 15 reps, squat in front of mirror        PT Education - 07/09/18 1346    Education Details  Educated on progress towards goals and on readiness for discharge. Educated on updated HEP with advanced lifting technique exercises to perform at home or the gym 2-3x/week.    Person(s) Educated  Patient    Methods  Explanation    Comprehension  Verbalized understanding       PT Short Term Goals - 07/09/18 1306      PT SHORT TERM GOAL #1   Title  Patient will be independent with HEP, updated PRN, to improve lumbar mobility and LE strength to be able to tolerate 12 hour work days and lifting required to return to work without increasing his pain.     Baseline  06/25/18: Reports compliance wiht HEP daily    Status  Achieved      PT SHORT TERM GOAL #2   Title  Patient will have no pain with AROM for lumbar spine indicating improved tolerance to extension and Lt lumbar intervertebral joint closing to be able to return to lefitng and extension based posture without pain.     Baseline  --    Status  Achieved      PT SHORT TERM GOAL #3   Title  Patient will improve LE strength in Lt LE by 1/2 grade to indicate improved leg strength to be able to ambulate with more normalized gait and improved endurance.    Baseline  --    Status  Achieved        PT Long Term Goals - 07/09/18 1306      PT LONG TERM GOAL #1   Title  Patient will be able to demosntrate proper lifting mechanics for floor to varying heights with 30lb box and no pain provocation to demonstrate readiness to return to work with decrease risk for back injury.    Baseline  06/25/2018: Continues to require cueing with squat mechanics    Status  Achieved      PT LONG TERM GOAL #2   Title  Patient will ambulate with normalized gait pattern at 1.2 m/s or faster during 2 MWT to demonstrate safe community walking speeds and imrpoved abiltiy to navigate work environment with  walking demands.     Baseline  06/25/18: gait speed at 1.5 m/s during 2MWT    Status  Achieved      PT LONG TERM GOAL #3   Title  Patient will report no radicular symptoms below his knee for 1 week or greater period to indicate centralization and reduce nerve compression to further improve LE strength and reduce pain.    Baseline  Reduce radicular symptoms.     Status  Partially Met         Plan - 07/09/18 1343    Clinical Impression Statement  Re-assessment performed today and patient has made excellent progress towards goals. He has significantly improved bil LE strength and lumbar ROM is WNL's for all planes and pain free this date. He has demonstrated proper lifting mechanics with weights up to 30 lbs and has progressed functional strengthening to lifting based exercises. He has been educated on centralization of radicular symptoms and provided an advanced HEP to continue progressing independently. He has initiated independent exercise at his gym and is planning to continue performing exercises at gym to improve strength for  return to work. He has stated he is comfortable with discharging to independent program at this time. He will be discharged following today's session.    Rehab Potential  Good    PT Frequency  2x / week    PT Duration  6 weeks    PT Treatment/Interventions  ADLs/Self Care Home Management;Aquatic Therapy;Cryotherapy;Electrical Stimulation;Iontophoresis 20m/ml Dexamethasone;Moist Heat;Traction;Gait training;Stair training;Functional mobility training;Therapeutic activities;Therapeutic exercise;Patient/family education;Balance training;Neuromuscular re-education;Manual techniques;Passive range of motion;Taping    PT Next Visit Plan  discharge    PT Home Exercise Plan  Eval: lumbar extension stretch; 12/12: bridge and clam; 06/23/2018 - prone, prone on elbows, prone press ups, standing lumbar extension. TB postural training.     Consulted and Agree with Plan of Care  Patient        Patient will benefit from skilled therapeutic intervention in order to improve the following deficits and impairments:  Abnormal gait, Decreased activity tolerance, Decreased endurance, Decreased range of motion, Decreased strength, Pain, Improper body mechanics, Decreased mobility, Difficulty walking, Postural dysfunction, Obesity, Impaired flexibility  Visit Diagnosis: Radiculopathy, lumbar region  Muscle weakness (generalized)  Other abnormalities of gait and mobility     Problem List Patient Active Problem List   Diagnosis Date Noted  . Elevated LFTs 05/08/2018  . Lumbar disc herniation with radiculopathy 05/06/2018    RKipp Brood PT, DPT Physical [facility]rap[facility] 07/09/2018 2:01 PM    [address] [address]Phone: [phone]  Fax:  [phone] Name: CMilas SchappellMRN: [id number] of Birth: 108/14/1992

## 2018-07-09 NOTE — Patient Instructions (Signed)
Step 1  Step 2  Standing Shoulder Single Arm PNF D2 Flexion with Anchored Resistance reps: 10-15  sets: 2-3  daily: 1  weekly: 7 Setup  Begin in a standing upright position with one arm crossed in front of your body, thumb pointing down, holding the end of a resistance band that is anchored below. Movement  Slowly raise your arm overhead across your body, rotating your arm so your thumb points up. Reverse this motion back to the starting position, and repeat. Tip  Make sure to maintain an upright posture and do not let your body turn as you move your arm. Step 1  Step 2  Step 3  Shoulder PNF D2 with Resistance reps: 10-15  sets: 2-3  daily: 1  weekly: 7 Setup  Begin standing upright with one arm crossed in front of your body, thumb pointing down, holding a resistance band that is looped around the opposite foot.  Movement  Diagonally raise your arm overhead across your body, turning your arm so your thumb points up. Slowly reverse the movement and repeat. Tip  Make sure to keep your elbow straight. Do not shrug your shoulders or arch your low back during the exercise. Step 1  Step 2  Deadlift with Resistance reps: 10  sets: 3  daily: 1  weekly: 7 Setup  Begin in a standing upright position with holding both ends of a resistance band that is anchored under your feet. Movement  Bend at your hips and knees, lowering your arms toward the ground, then stand back up, engaging your back and thigh muscles and pulling up on the resistance band with your arms straight. Tip  Make sure to keep your abdominals tight and back straight during the exercise. Step 1  Step 2  Supine Bridge reps: 10  sets: 3  daily: 1  weekly: 7 Setup  Begin lying on your back with your arms resting at your sides, your legs bent at the knees and your feet flat on the ground. Movement  Tighten your abdominals and slowly lift your hips off the floor into a bridge position, keeping your back straight. Tip    Make sure to keep your trunk stiff throughout the exercise and your arms flat on the floor. Disclaimer: This program provides exercises related to your condition that you can perform at home. As there is a risk of injury with any activity, use caution when performing exercises. If you experience any pain or discomfort, discontinue the exercises and contact your health care provider.  Login URL: [email or web address] . Access Code: [email or web address] . Date printed: 07/09/2018 Page 1  Step 1  Step 2  Clamshell with Resistance reps: 10  sets: 3  daily: 1  weekly: 7 Setup  Begin by lying on your side with your knees bent 90 degrees, hips and shoulders stacked, and a resistance loop secured around your legs.  Movement  Raise your top knee away from the bottom one, then slowly return to the starting position.  Tip  Make sure not to roll your hips forward or backward during the exercise. Step 1  Step 2  Standing Lumbar Extension with Counter reps: 10  sets: 3  daily: 1  weekly: 7 Setup  Begin in a standing upright position in front of a counter, with your hands resting on your hips. Movement  Slowly arch your trunk backwards and hold. Tip  Make sure to use the counter for balance as needed and do not bend your knees during the  exercise. Step 1  Step 2  Prone Press Up on Elbows reps: 10  sets: 3  daily: 1  weekly: 7 Setup  Begin lying on your stomach, resting on your elbows low to the ground. Movement  Push up on your elbows, bending your back upward. Tip  Make sure to keep your hips in contact with the floor and maintain a gentle chin tuck throughout the exercise. Step 1  Step 2  Prone Press Up reps: 10  sets: 3  daily: 1  weekly: 7 Setup  Begin lying on your stomach, with your hands by your shoulders resting flat on the ground. Movement  Push against the floor with your hands, bending your back upward. Tip  Make sure to keep your hips in contact with the floor and maintain a  gentle chin tuck throughout the exercise.

## 2018-07-29 ENCOUNTER — Ambulatory Visit: Payer: 59 | Admitting: Internal Medicine

## 2018-07-29 ENCOUNTER — Encounter: Payer: Self-pay | Admitting: Internal Medicine

## 2018-07-29 ENCOUNTER — Other Ambulatory Visit: Payer: Self-pay

## 2018-07-29 VITALS — BP 125/80 | HR 98 | Temp 98.1°F | Ht 75.0 in | Wt 268.3 lb

## 2018-07-29 DIAGNOSIS — Z791 Long term (current) use of non-steroidal anti-inflammatories (NSAID): Secondary | ICD-10-CM | POA: Diagnosis not present

## 2018-07-29 DIAGNOSIS — M5116 Intervertebral disc disorders with radiculopathy, lumbar region: Secondary | ICD-10-CM

## 2018-07-29 DIAGNOSIS — Z79899 Other long term (current) drug therapy: Secondary | ICD-10-CM | POA: Diagnosis not present

## 2018-07-29 MED ORDER — OMEPRAZOLE MAGNESIUM 20 MG PO TBEC: 20.0000 mg | DELAYED_RELEASE_TABLET | Freq: Every day | ORAL | 2 refills | Status: AC | PRN

## 2018-07-29 MED ORDER — MELOXICAM 15 MG PO TABS: 15.0000 mg | ORAL_TABLET | Freq: Every day | ORAL | 2 refills | Status: AC

## 2018-07-29 NOTE — Assessment & Plan Note (Signed)
  Lumbar disc herniation with radiculopathy: Patient here today to follow-up for recent evaluation of his lumbar disc herniation pain.  He was hospitalized late last year due to the severity of the pain and discharged with the recommendation for physical therapy.  He states that since completing the physical therapy and over the past week he has noted significant improvement.  He feels he is able to return to work with a slight limitation.  He feels he is able to lift up to the maximum recommended of 40 pounds.  Given improvement in his symptoms, the denial of radiating pain, his lack of loss of bowel or bladder control, or other concerning symptoms I agree that he is likely able to return to work. He was advised strongly that if he resumes the initial behavior no actions to resulted in his injury that this injury is likely to recur.  He must continue to strengthen his lower back abdominal muscles.  Plan: I have provided the patient with a letter releasing him for work with the following restrictions: I have recommended that he not lift over 40 pounds alone, I have recommended that he not be on his feet for than 4 hours without a 15-minute break.  In addition, I advised him that his pain significantly worsens that he should notify us for additional evaluation. He is to continue the Mobic as needed for pain He is to continue the omeprazole daily We have discussed discontinuing the gabapentin

## 2018-07-29 NOTE — Patient Instructions (Addendum)
FOLLOW-UP INSTRUCTIONS When: In one year for a routine visit or sooner if needed What to bring: All of your medications  I have not made any changes to your medications today.   Today we discussed your herniated disc. It appears that the physical therapy and time have improved your symptoms significantly. As such, given that you feel you are able to return to work I have provided you with a letter to that effect.  If your symptoms worsen, or they feel to resolve please notify us. I would reiterate that you avoid activity that caused the injury.  Change in behavior when lifting heavy objects is required to avoid reinjuring the area or similar injury.  I would advise you to utilize current medications PRN but not more than prescribed for the pain. Please continue to take the omeprazole as well prevent the occurrence of gastric ulcers while you are on high-dose nonsteroidal anti-inflammatories.  Thank you for your visit to the [facility] today. If you have any questions or concerns please call us at [phone].

## 2018-07-29 NOTE — Progress Notes (Signed)
   CC: back pain  HPI:[name] is a 28 y.o. male who presents for evaluation of back pain follow up visit. Please see individual problem based A/P for details.   Past Medical History:  Diagnosis Date  . Lumbar back pain with radiculopathy affecting left lower extremity    Review of Systems: ROS negative except as per HPI  Physical Exam: Vitals:   07/29/18 1328  BP: 125/80  Pulse: 98  Temp: 98.1 F (36.7 C)  TempSrc: Oral  SpO2: 95%  Weight: 268 lb 4.8 oz (121.7 kg)  Height: 6\' 3"  (1.905 m)   General: A/O x4, in no acute distress, afebrile, nondiaphoretic Cardio: RRR, no mrg's Pulmonary: CTA bilaterally MSK: BLE nontender, nonedematous, there is full ROM of the lower spine with minimal pain on palpation.   Assessment & Plan:   See Encounters Tab for problem based charting.  Patient discussed with Dr. Oswaldo Done

## 2018-07-30 NOTE — Progress Notes (Signed)
Internal Medicine Clinic Attending  Case discussed with Dr. Harbrecht at the time of the visit.  We reviewed the resident's history and exam and pertinent patient test results.  I agree with the assessment, diagnosis, and plan of care documented in the resident's note.   

## 2018-08-04 ENCOUNTER — Encounter: Payer: 59 | Admitting: Internal Medicine

## 2018-08-04 ENCOUNTER — Ambulatory Visit: Payer: 59

## 2019-05-26 ENCOUNTER — Encounter: Payer: Self-pay | Admitting: Internal Medicine

## 2019-05-26 ENCOUNTER — Encounter: Payer: 59 | Admitting: Internal Medicine

## 2019-11-08 ENCOUNTER — Encounter: Payer: Self-pay | Admitting: *Deleted

## 2020-04-29 IMAGING — DX DG SI JOINTS 3+V
3 series · 3 of 3 positions shown · non-contrast
Comparison: None.

CLINICAL DATA: Lower back and pelvic pain [REDACTED].

EXAM:
BILATERAL SACROILIAC JOINTS - 3+ VIEW

[si joints ap]
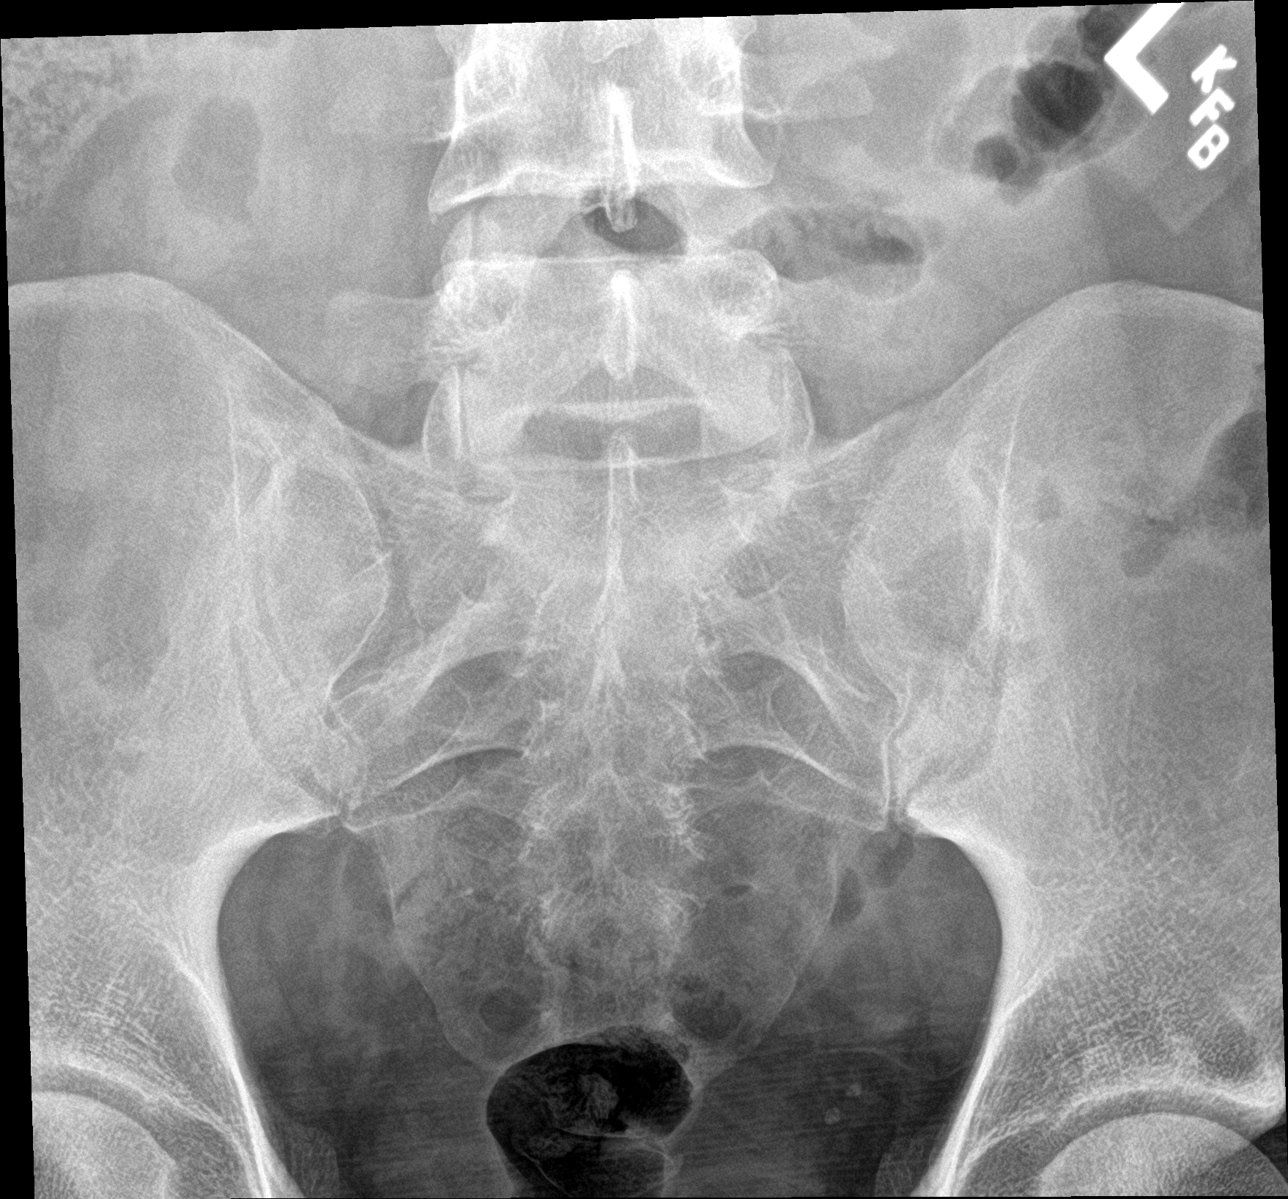

[si joints obl (1 of 2)]
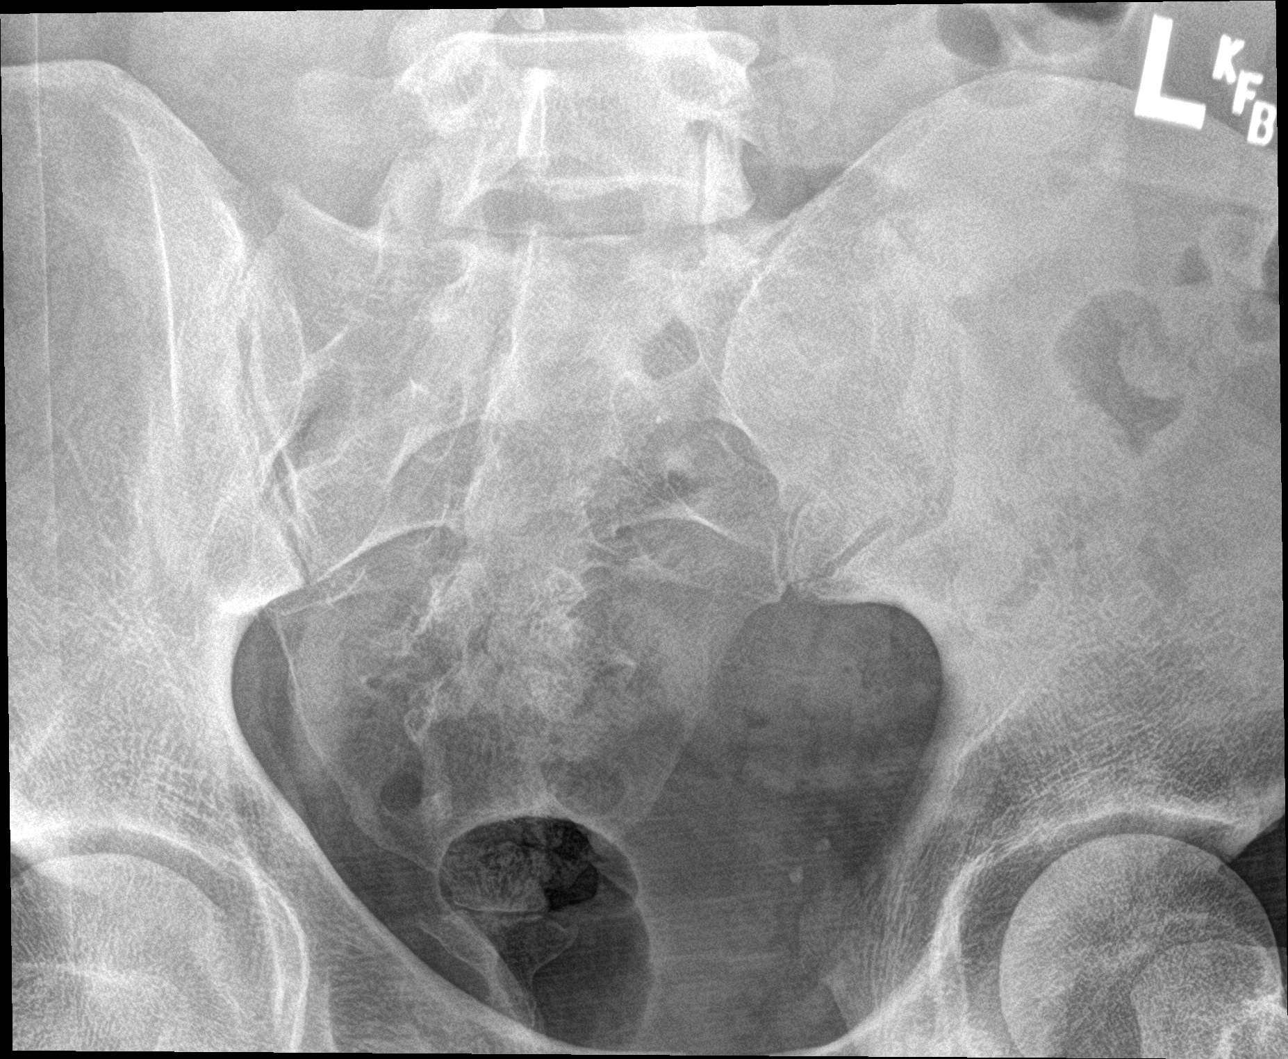

[si joints obl (2 of 2)]
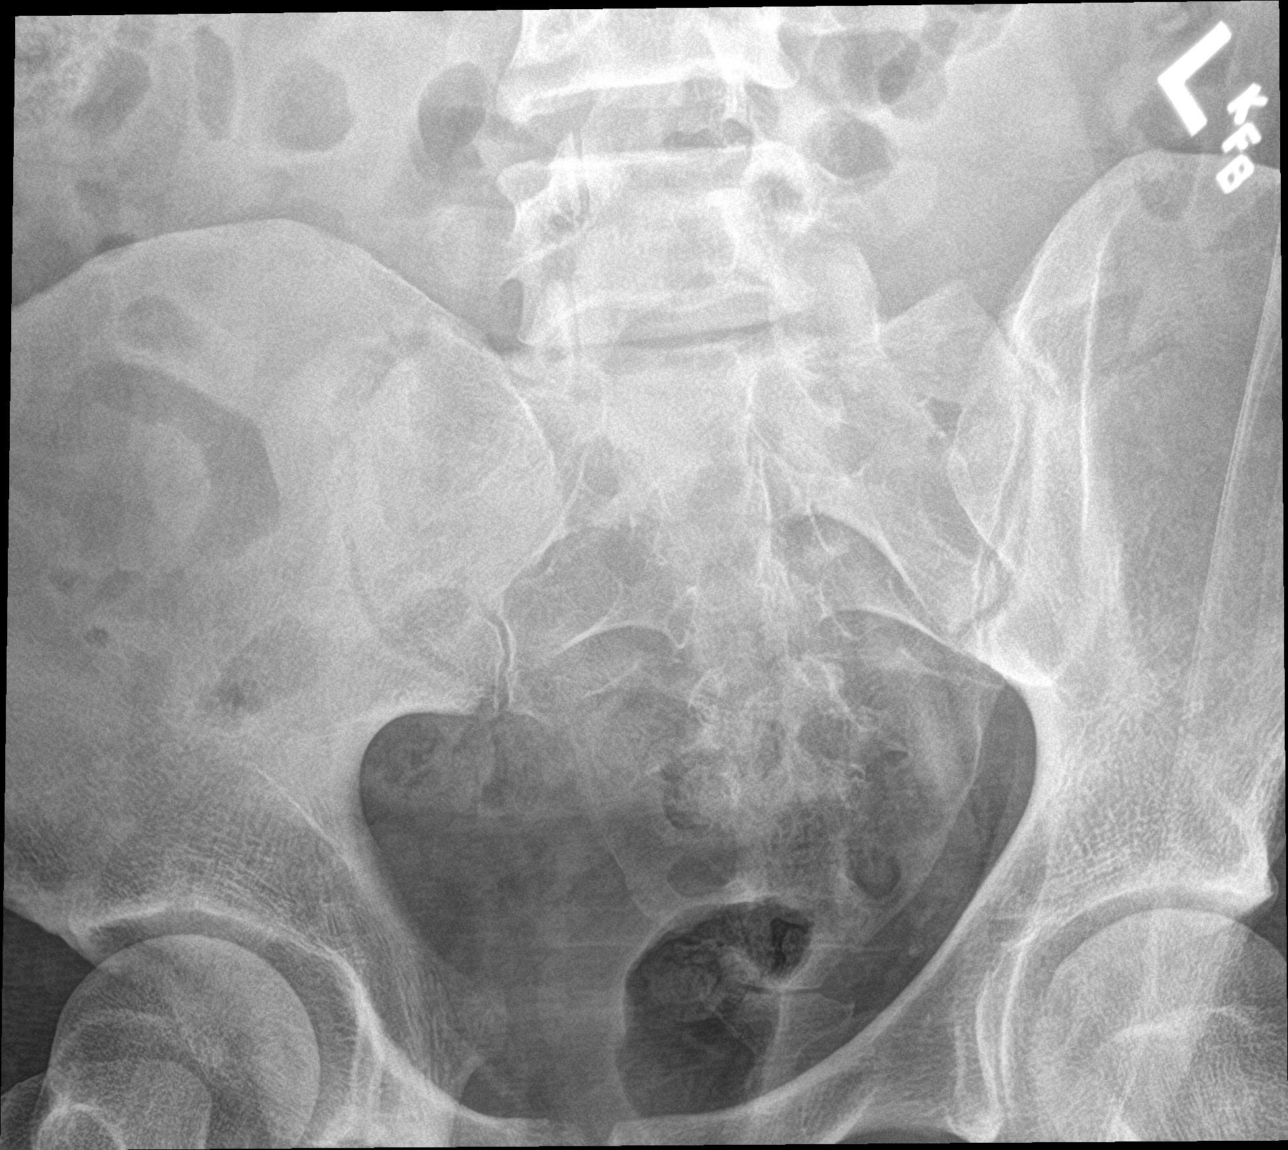

[3 of 3 positions shown; findings below may reference images not displayed]

FINDINGS: The sacroiliac joint spaces are maintained and there is no evidence
of arthropathy. No other bone abnormalities are seen.
IMPRESSION: Negative.

## 2021-05-07 ENCOUNTER — Ambulatory Visit
Admission: EM | Admit: 2021-05-07 | Discharge: 2021-05-07 | Disposition: A | Discharge status: Home / Self Care | Payer: 59 | Attending: Family Medicine | Admitting: Family Medicine

## 2021-05-07 ENCOUNTER — Other Ambulatory Visit: Payer: Self-pay

## 2021-05-07 DIAGNOSIS — H6122 Impacted cerumen, left ear: Secondary | ICD-10-CM

## 2021-05-07 DIAGNOSIS — H65192 Other acute nonsuppurative otitis media, left ear: Secondary | ICD-10-CM

## 2021-05-07 MED ORDER — FLUTICASONE PROPIONATE 50 MCG/ACT NA SUSP: 1.0000 | Freq: Two times a day (BID) | NASAL | 0 refills | Status: AC

## 2021-05-07 MED ORDER — PSEUDOEPHEDRINE HCL 30 MG PO TABS: 30.0000 mg | ORAL_TABLET | Freq: Four times a day (QID) | ORAL | 0 refills | Status: AC | PRN

## 2021-05-07 NOTE — ED Triage Notes (Signed)
Pt presents with c/o left ear pain with drainage for past couple days

## 2021-05-07 NOTE — ED Provider Notes (Signed)
[facility]    CSN: [id number] Arrival date & time: 05/07/21  1007      History   Chief Complaint Chief Complaint  Patient presents with   Otalgia    HPI Brett Graves is a 30 y.o. male.   Presenting today with several day history of left ear fullness, muffled hearing, drainage.  States he is tried earwax removal kits over-the-counter with minimal relief.  Now having radiation of pain into the left side of face from the ear.  Denies fever, chills, congestion, cough, recent illness.  No past history of ear infections.    Past Medical History:  Diagnosis Date   Lumbar back pain with radiculopathy affecting left lower extremity     Patient Active Problem List   Diagnosis Date Noted   Elevated LFTs 05/08/2018   Lumbar disc herniation with radiculopathy 05/06/2018    Past Surgical History:  Procedure Laterality Date   NO PAST SURGERIES         Home Medications    Prior to Admission medications   Medication Sig Start Date End Date Taking? Authorizing Provider  fluticasone (FLONASE) 50 MCG/ACT nasal spray Place 1 spray into both nostrils 2 (two) times daily. 05/07/21  Yes Particia Nearing, PA-C  pseudoephedrine (SUDAFED) 30 MG tablet Take 1 tablet (30 mg total) by mouth every 6 (six) hours as needed for congestion. 05/07/21  Yes Particia Nearing, PA-C  gabapentin (NEURONTIN) 300 MG capsule Take 2 capsules (600 mg total) by mouth at bedtime. 06/29/18   Arnetha Courser, MD  meloxicam (MOBIC) 15 MG tablet Take 1 tablet (15 mg total) by mouth daily. 07/29/18   Lanelle Bal, MD  omeprazole (PRILOSEC OTC) 20 MG tablet Take 1 tablet (20 mg total) by mouth daily as needed (for indigestion or reflux). 07/29/18   Lanelle Bal, MD    Family History Family History  Problem Relation Age of Onset   Lumbar disc disease Father    Lumbar disc disease Paternal Grandfather     Social History Social History   Tobacco Use   Smoking status:  Never   Smokeless tobacco: Never  Vaping Use   Vaping Use: Never used  Substance Use Topics   Alcohol use: Not Currently    Comment: 05/07/2018 "might have a drink q 3 months; if that"   Drug use: Never     Allergies   Patient has no known allergies.   Review of Systems Review of Systems Per HPI  Physical Exam Triage Vital Signs ED Triage Vitals  Enc Vitals Group     BP 05/07/21 1227 135/84     Pulse Rate 05/07/21 1227 78     Resp 05/07/21 1227 18     Temp 05/07/21 1227 98.1 F (36.7 C)     Temp src --      SpO2 05/07/21 1227 97 %     Weight --      Height --      Head Circumference --      Peak Flow --      Pain Score 05/07/21 1226 4     Pain Loc --      Pain Edu? --      Excl. in GC? --    No data found.  Updated Vital Signs BP 135/84   Pulse 78   Temp 98.1 F (36.7 C)   Resp 18   SpO2 97%   Visual Acuity Right Eye Distance:   Left Eye Distance:   Bilateral  Distance:    Right Eye Near:   Left Eye Near:    Bilateral Near:     Physical Exam Vitals and nursing note reviewed.  Constitutional:      Appearance: Normal appearance.  HENT:     Head: Atraumatic.     Right Ear: Tympanic membrane and external ear normal.     Left Ear: There is impacted cerumen.     Ears:     Comments: TM benign and intact post left ear lavage with full clearance of EAC.  Minimal middle ear effusion present left ear    Nose: Nose normal.     Mouth/Throat:     Mouth: Mucous membranes are moist.  Eyes:     Extraocular Movements: Extraocular movements intact.     Conjunctiva/sclera: Conjunctivae normal.  Cardiovascular:     Rate and Rhythm: Normal rate and regular rhythm.  Pulmonary:     Effort: Pulmonary effort is normal.     Breath sounds: Normal breath sounds.  Musculoskeletal:        General: Normal range of motion.     Cervical back: Normal range of motion and neck supple.  Skin:    General: Skin is warm and dry.  Neurological:     General: No focal deficit  present.     Mental Status: He is oriented to person, place, and time.  Psychiatric:        Mood and Affect: Mood normal.        Thought Content: Thought content normal.        Judgment: Judgment normal.     UC Treatments / Results  Labs (all labs ordered are listed, but only abnormal results are displayed) Labs Reviewed - No data to display  EKG   Radiology No results found.  Procedures Procedures (including critical care time)  Medications Ordered in UC Medications - No data to display  Initial Impression / Assessment and Plan / UC Course  I have reviewed the triage vital signs and the nursing notes.  Pertinent labs & imaging results that were available during my care of the patient were reviewed by me and considered in my medical decision making (see chart for details).     Left ear lavage performed with warm water and hydrogen peroxide today without complication and full clearance of left EAC.  Suspect this was the majority of the cause of his symptoms but we will also prescribe Flonase and Sudafed to with mild effusion.  Return for acutely worsening symptoms.  Final Clinical Impressions(s) / UC Diagnoses   Final diagnoses:  Impacted cerumen of left ear  Acute effusion of left ear   Discharge Instructions   None    ED Prescriptions     Medication Sig Dispense Auth. Provider   fluticasone (FLONASE) 50 MCG/ACT nasal spray Place 1 spray into both nostrils 2 (two) times daily. 16 g Particia Nearing, [place]   pseudoephedrine (SUDAFED) 30 MG tablet Take 1 tablet (30 mg total) by mouth every 6 (six) hours as needed for congestion. 15 tablet Particia Nearing, [place]      PDMP not reviewed this encounter.   Particia Nearing, [place] 05/07/21 1756
